# Patient Record
Sex: Female | Born: 1945 | Race: White | Hispanic: No | Marital: Married | Smoking: Never smoker
Health system: Southern US, Community
[De-identification: ages and names within clinical notes are randomized; demographics above are authoritative.]

## PROBLEM LIST (undated history)

## (undated) DIAGNOSIS — IMO0002 Reserved for concepts with insufficient information to code with codable children: Secondary | ICD-10-CM

## (undated) DIAGNOSIS — G43909 Migraine, unspecified, not intractable, without status migrainosus: Secondary | ICD-10-CM

## (undated) DIAGNOSIS — I00 Rheumatic fever without heart involvement: Secondary | ICD-10-CM

## (undated) DIAGNOSIS — I10 Essential (primary) hypertension: Secondary | ICD-10-CM

## (undated) DIAGNOSIS — IMO0001 Reserved for inherently not codable concepts without codable children: Secondary | ICD-10-CM

## (undated) DIAGNOSIS — M1990 Unspecified osteoarthritis, unspecified site: Secondary | ICD-10-CM

## (undated) DIAGNOSIS — Z5189 Encounter for other specified aftercare: Secondary | ICD-10-CM

## (undated) DIAGNOSIS — M8580 Other specified disorders of bone density and structure, unspecified site: Secondary | ICD-10-CM

## (undated) DIAGNOSIS — N200 Calculus of kidney: Secondary | ICD-10-CM

## (undated) DIAGNOSIS — K219 Gastro-esophageal reflux disease without esophagitis: Secondary | ICD-10-CM

## (undated) HISTORY — PX: OTHER SURGICAL HISTORY: SHX169

## (undated) HISTORY — DX: Gastro-esophageal reflux disease without esophagitis: K21.9

## (undated) HISTORY — DX: Other specified disorders of bone density and structure, unspecified site: M85.80

## (undated) HISTORY — PX: PARTIAL HYSTERECTOMY: SHX80

## (undated) HISTORY — DX: Reserved for concepts with insufficient information to code with codable children: IMO0002

## (undated) HISTORY — DX: Essential (primary) hypertension: I10

## (undated) HISTORY — DX: Migraine, unspecified, not intractable, without status migrainosus: G43.909

## (undated) HISTORY — DX: Unspecified osteoarthritis, unspecified site: M19.90

---

## 1998-06-22 ENCOUNTER — Other Ambulatory Visit: Admission: RE | Admit: 1998-06-22 | Discharge: 1998-06-22 | Payer: Self-pay | Admitting: Gynecology

## 1999-07-14 ENCOUNTER — Other Ambulatory Visit: Admission: RE | Admit: 1999-07-14 | Discharge: 1999-07-14 | Payer: Self-pay | Admitting: Gynecology

## 2000-08-15 ENCOUNTER — Other Ambulatory Visit: Admission: RE | Admit: 2000-08-15 | Discharge: 2000-08-15 | Payer: Self-pay | Admitting: Gynecology

## 2001-08-27 ENCOUNTER — Other Ambulatory Visit: Admission: RE | Admit: 2001-08-27 | Discharge: 2001-08-27 | Payer: Self-pay | Admitting: Gynecology

## 2002-04-16 DIAGNOSIS — N200 Calculus of kidney: Secondary | ICD-10-CM

## 2002-04-16 HISTORY — DX: Calculus of kidney: N20.0

## 2002-04-30 ENCOUNTER — Encounter: Payer: Self-pay | Admitting: General Surgery

## 2002-04-30 ENCOUNTER — Encounter: Admission: RE | Admit: 2002-04-30 | Discharge: 2002-04-30 | Payer: Self-pay | Admitting: Women's Health

## 2002-09-16 ENCOUNTER — Encounter: Payer: Self-pay | Admitting: Surgery

## 2002-09-16 ENCOUNTER — Encounter: Admission: RE | Admit: 2002-09-16 | Discharge: 2002-09-16 | Payer: Self-pay | Admitting: Surgery

## 2002-09-25 ENCOUNTER — Encounter: Payer: Self-pay | Admitting: Surgery

## 2002-09-25 ENCOUNTER — Encounter: Admission: RE | Admit: 2002-09-25 | Discharge: 2002-09-25 | Payer: Self-pay | Admitting: Surgery

## 2002-10-14 ENCOUNTER — Other Ambulatory Visit: Admission: RE | Admit: 2002-10-14 | Discharge: 2002-10-14 | Payer: Self-pay | Admitting: Gynecology

## 2002-11-07 ENCOUNTER — Observation Stay (HOSPITAL_COMMUNITY): Admission: EM | Admit: 2002-11-07 | Discharge: 2002-11-08 | Payer: Self-pay

## 2002-11-07 ENCOUNTER — Encounter: Payer: Self-pay | Admitting: General Surgery

## 2002-11-12 ENCOUNTER — Ambulatory Visit (HOSPITAL_BASED_OUTPATIENT_CLINIC_OR_DEPARTMENT_OTHER): Admission: RE | Admit: 2002-11-12 | Discharge: 2002-11-12 | Payer: Self-pay | Admitting: Urology

## 2002-11-12 ENCOUNTER — Encounter: Payer: Self-pay | Admitting: Urology

## 2003-01-08 ENCOUNTER — Encounter: Admission: RE | Admit: 2003-01-08 | Discharge: 2003-01-08 | Payer: Self-pay | Admitting: Diagnostic Radiology

## 2003-10-14 ENCOUNTER — Other Ambulatory Visit: Admission: RE | Admit: 2003-10-14 | Discharge: 2003-10-14 | Payer: Self-pay | Admitting: Gynecology

## 2004-02-24 ENCOUNTER — Encounter: Admission: RE | Admit: 2004-02-24 | Discharge: 2004-02-24 | Payer: Self-pay | Admitting: Family Medicine

## 2004-08-24 ENCOUNTER — Encounter: Admission: RE | Admit: 2004-08-24 | Discharge: 2004-08-24 | Payer: Self-pay | Admitting: Surgery

## 2004-10-19 ENCOUNTER — Other Ambulatory Visit: Admission: RE | Admit: 2004-10-19 | Discharge: 2004-10-19 | Payer: Self-pay | Admitting: Gynecology

## 2005-11-14 ENCOUNTER — Other Ambulatory Visit: Admission: RE | Admit: 2005-11-14 | Discharge: 2005-11-14 | Payer: Self-pay | Admitting: Gynecology

## 2006-02-22 ENCOUNTER — Encounter: Payer: Self-pay | Admitting: Family Medicine

## 2006-12-05 ENCOUNTER — Other Ambulatory Visit: Admission: RE | Admit: 2006-12-05 | Discharge: 2006-12-05 | Payer: Self-pay | Admitting: Gynecology

## 2007-04-17 LAB — HM MAMMOGRAPHY

## 2007-09-19 ENCOUNTER — Encounter: Admission: RE | Admit: 2007-09-19 | Discharge: 2007-09-19 | Payer: Self-pay | Admitting: Specialist

## 2007-09-25 ENCOUNTER — Encounter: Admission: RE | Admit: 2007-09-25 | Discharge: 2007-09-25 | Payer: Self-pay | Admitting: Specialist

## 2008-01-14 ENCOUNTER — Inpatient Hospital Stay (HOSPITAL_COMMUNITY): Admission: RE | Admit: 2008-01-14 | Discharge: 2008-01-18 | Payer: Self-pay | Admitting: Orthopedic Surgery

## 2008-09-01 ENCOUNTER — Encounter: Payer: Self-pay | Admitting: Family Medicine

## 2008-09-23 DIAGNOSIS — Z87442 Personal history of urinary calculi: Secondary | ICD-10-CM

## 2008-09-23 DIAGNOSIS — G43909 Migraine, unspecified, not intractable, without status migrainosus: Secondary | ICD-10-CM | POA: Insufficient documentation

## 2008-09-23 DIAGNOSIS — M1990 Unspecified osteoarthritis, unspecified site: Secondary | ICD-10-CM | POA: Insufficient documentation

## 2008-09-23 DIAGNOSIS — IMO0002 Reserved for concepts with insufficient information to code with codable children: Secondary | ICD-10-CM

## 2008-09-23 DIAGNOSIS — I019 Acute rheumatic heart disease, unspecified: Secondary | ICD-10-CM

## 2008-09-23 DIAGNOSIS — K219 Gastro-esophageal reflux disease without esophagitis: Secondary | ICD-10-CM

## 2008-09-24 ENCOUNTER — Ambulatory Visit: Payer: Self-pay | Admitting: Cardiology

## 2008-09-24 DIAGNOSIS — I099 Rheumatic heart disease, unspecified: Secondary | ICD-10-CM | POA: Insufficient documentation

## 2008-09-24 DIAGNOSIS — I10 Essential (primary) hypertension: Secondary | ICD-10-CM

## 2008-09-24 DIAGNOSIS — R109 Unspecified abdominal pain: Secondary | ICD-10-CM | POA: Insufficient documentation

## 2008-09-24 DIAGNOSIS — R011 Cardiac murmur, unspecified: Secondary | ICD-10-CM

## 2008-10-08 ENCOUNTER — Ambulatory Visit: Payer: Self-pay

## 2008-10-08 ENCOUNTER — Encounter: Payer: Self-pay | Admitting: Cardiology

## 2009-01-11 ENCOUNTER — Encounter: Admission: RE | Admit: 2009-01-11 | Discharge: 2009-01-11 | Payer: Self-pay | Admitting: Orthopedic Surgery

## 2009-02-14 ENCOUNTER — Encounter: Payer: Self-pay | Admitting: *Deleted

## 2009-02-14 LAB — CONVERTED CEMR LAB: Pap Smear: NORMAL

## 2009-03-02 ENCOUNTER — Encounter: Payer: Self-pay | Admitting: Family Medicine

## 2009-03-03 ENCOUNTER — Encounter: Payer: Self-pay | Admitting: Cardiology

## 2009-06-06 ENCOUNTER — Encounter: Payer: Self-pay | Admitting: *Deleted

## 2009-06-09 ENCOUNTER — Ambulatory Visit: Payer: Self-pay | Admitting: Family Medicine

## 2009-06-09 DIAGNOSIS — M949 Disorder of cartilage, unspecified: Secondary | ICD-10-CM

## 2009-06-09 DIAGNOSIS — M899 Disorder of bone, unspecified: Secondary | ICD-10-CM | POA: Insufficient documentation

## 2010-05-01 ENCOUNTER — Encounter: Payer: Self-pay | Admitting: Family Medicine

## 2010-05-16 NOTE — Assessment & Plan Note (Signed)
Summary: BRAND NEW PT TO LBF/TO EST/CJR/RSC PT/CJR   Vital Signs:  Patient profile:   65 year old female Menstrual status:  postmenopausal Height:      62.50 inches Weight:      157 pounds Temp:     98.0 degrees F oral Pulse rate:   88 / minute Pulse rhythm:   regular Resp:     12 per minute BP sitting:   138 / 78  (left arm) Cuff size:   regular  Vitals Entered By: Sid Falcon LPN (June 09, 2009 3:21 PM) CC: New to establish, Hypertension Management   History of Present Illness: New patient to establish care.  Past medical history significant for hypertension which was recently diagnosed, GERD, migraine headaches, history of kidney stones, history of rheumatic fever in childhood with recent essentially normal echocardiogram. History right total hip replacement 2009 secondary to complications from fracture.  No recent kidnesy stones.  Kidney stone extraction 2004.  GERD symptoms controlled with Zegerid.  No migraine headaches in a few years.  Patient recently started on Micardis for hypertension. Feels much better overall. No side effects from medication.  Prevention:  GYN ([place]).  DEXA 02/2009 Osteopenia.  Pt on Ca and Vit D.  Mammogram 5/10 normal.  Lipids 11/10 HDL 75, LDL 107.  Virtual Colonoscopy 2009.  Hypertension History:      She denies headache, chest pain, palpitations, dyspnea with exertion, orthopnea, peripheral edema, neurologic problems, syncope, and side effects from treatment.        Positive major cardiovascular risk factors include female age 61 years old or older and hypertension.  Negative major cardiovascular risk factors include non-tobacco-user status.     Preventive Screening-Counseling & Management  Alcohol-Tobacco     Smoking Status: never  Allergies: 1)  ! Sulfa  Past History:  Family History: Last updated: 09/23/2008  COPD.  Aneurysm.   Social History: Last updated: 06/09/2009 Married  Tobacco Use - No.  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 01/14/2008   1. Hysterectomy ( Partial sec to fibroids)  2. Kidney stones removed. .. 11/12/2002  3. Bartholin cyst surgery PMH-FH-SH reviewed for relevance  Social History: Married  Tobacco Use - No.  Works with [employer]  Review of Systems  The patient denies anorexia, fever, weight loss, weight gain, chest pain, syncope, dyspnea on exertion, peripheral edema, prolonged cough, headaches, hemoptysis, abdominal pain, melena, hematochezia, severe indigestion/heartburn, hematuria, suspicious skin lesions, depression, enlarged lymph nodes, and breast masses.    Physical Exam  General:  Well-developed,well-nourished,in no acute distress; alert,appropriate and cooperative throughout examination Head:  Normocephalic and atraumatic without obvious abnormalities. No apparent alopecia or balding. Eyes:  pupils equal, pupils round, and pupils reactive to light.   Ears:  External ear exam shows no significant lesions or deformities.  Otoscopic examination reveals clear canals, tympanic membranes are intact bilaterally without bulging, retraction, inflammation or discharge. Hearing is grossly normal bilaterally. Mouth:  Oral mucosa and oropharynx without lesions or exudates.  Teeth in good repair. Neck:  No deformities, masses, or tenderness noted. Lungs:  Normal respiratory effort, chest expands symmetrically. Lungs are clear to auscultation, no crackles or wheezes. Heart:  normal rate and regular rhythm.   Msk:  No deformity or scoliosis noted of  thoracic or lumbar spine.   Extremities:  No clubbing, cyanosis,  edema, or deformity noted with normal full range of motion of all joints.   Neurologic:  alert & oriented X3, cranial nerves II-XII intact, strength normal in all extremities, and gait normal.   Cervical Nodes:  No lymphadenopathy noted Psych:  Oriented X3, normally interactive, good eye contact, not anxious appearing, and not depressed appearing.     Impression & Recommendations:  Problem # 1:  HYPERTENSION, BENIGN (ICD-401.1) controlled. Her updated medication list for this problem includes:    Micardis Hct 80-25 Mg Tabs (Telmisartan-hctz) ..... Once daily  Problem # 2:  GERD (ICD-530.81)  Her updated medication list for this problem includes:    Zegerid 40-1100 Mg Caps (Omeprazole-sodium bicarbonate) .Marland Kitchen... As needed  Problem # 3:  MIGRAINE HEADACHE (ICD-346.90)  Her updated medication list for this problem includes:    Maxalt 10 Mg Tabs (Rizatriptan benzoate) .Marland Kitchen... As needed    Aspirin 81 Mg Tbec (Aspirin) .Marland Kitchen... Take one tablet by mouth daily  Problem # 4:  RENAL CALCULUS, HX OF (ICD-V13.01)  Problem # 5:  OSTEOPENIA (ICD-733.90)  Her updated medication list for this problem includes:    Calcium-magnesium 500-250 Mg Tabs (Calcium-magnesium) .Marland Kitchen... 1 tab once daily    Vitamin D3 2000 Unit Caps (Cholecalciferol) .Marland Kitchen... 1 cap once daily  Complete Medication List: 1)  Climara 0.05 Mg/24hr Ptwk (Estradiol) .... Change weekly 2)  Maxalt 10 Mg Tabs (Rizatriptan benzoate) .... As needed 3)  Zegerid 40-1100 Mg Caps (Omeprazole-sodium bicarbonate) .... As needed 4)  Multivitamins Tabs (Multiple vitamin) .Marland Kitchen.. 1 tab once daily 5)  Ferrous Sulfate 325 (65 Fe) Mg Tabs (Ferrous sulfate) .Marland Kitchen.. 1 tab weekly 6)  Aspirin 81 Mg Tbec (Aspirin) .... Take one tablet by mouth daily 7)  Vitamin-b Complex Tabs (B complex vitamins) .Marland Kitchen.. 1 -2 tab once daily 8)  Silymarin Caps (Milk thistle-turmeric) .Marland Kitchen.. 1 cap once daily 9)  Ester-c 500-50 Mg Tabs (Bioflavonoid products) .Marland Kitchen.. 1 tab once daily 10)   Calcium-magnesium 500-250 Mg Tabs (Calcium-magnesium) .Marland Kitchen.. 1 tab once daily 11)  Vitamin D3 2000 Unit Caps (Cholecalciferol) .Marland Kitchen.. 1 cap once daily 12)  St Johns Wort 300 Mg Caps ([address] johns wort) .Marland Kitchen.. 1 cap three times a day 13)  Glucosamine-chondroitin Caps (Glucosamine-chondroit-vit c-mn) .Marland Kitchen.. 1 cap once daily 14)  Micardis Hct 80-25 Mg Tabs (Telmisartan-hctz) .... Once daily  Hypertension Assessment/Plan:      The patient's hypertensive risk group is category B: At least one risk factor (excluding diabetes) with no target organ damage.  Today's blood pressure is 138/78.    Patient Instructions: 1)  Please schedule a follow-up appointment in 6 months .  2)  It is important that you exercise reguarly at least 20 minutes 5 times a week. If you develop chest pain, have severe difficulty breathing, or feel very tired, stop exercising immediately and seek medical attention.  3)  Check your  Blood Pressure regularly . If it is above:140/90   you should make an appointment.  Preventive Care Screening  Last Tetanus Booster:    Date:  04/17/2003    Results:  Historical     Preventive Care Screening  Last Tetanus Booster:    Date:  04/17/2003    Results:  Historical

## 2010-05-24 NOTE — Letter (Signed)
Summary: Annual Exam-Dr. Beather Arbour  Annual Exam-Dr. Beather Arbour   Imported By: Maryln Gottron 05/15/2010 09:44:52  _____________________________________________________________________  External Attachment:    Type:   Image     Comment:   External Document

## 2010-08-11 ENCOUNTER — Encounter: Payer: Self-pay | Admitting: Family Medicine

## 2010-08-11 ENCOUNTER — Ambulatory Visit (INDEPENDENT_AMBULATORY_CARE_PROVIDER_SITE_OTHER): Payer: BC Managed Care – PPO | Admitting: Family Medicine

## 2010-08-11 VITALS — BP 122/72 | Temp 98.4°F | Wt 152.0 lb

## 2010-08-11 DIAGNOSIS — I10 Essential (primary) hypertension: Secondary | ICD-10-CM

## 2010-08-11 DIAGNOSIS — Z0000 Encounter for general adult medical examination without abnormal findings: Secondary | ICD-10-CM

## 2010-08-11 DIAGNOSIS — Z23 Encounter for immunization: Secondary | ICD-10-CM

## 2010-08-11 DIAGNOSIS — Z2911 Encounter for prophylactic immunotherapy for respiratory syncytial virus (RSV): Secondary | ICD-10-CM

## 2010-08-11 NOTE — Progress Notes (Signed)
  Subjective:    Patient ID: [name], female    DOB: 1945/04/25, 65 y.o.   MRN: [id number]  HPI Patient has history of hypertension, GERD, osteoarthritis and is seen today requesting shingles vaccine. No history of shingles clinically. Did have chickenpox as a child. She has seen several family members suffer with effects from shingles. She does not have any allergy to gelatin or neomycin. No immunosuppression. No recent prednisone use.  She has hypertension well controlled with Micardis HCTZ. Denies any recent chest pains, dyspnea, headache, fever, chills   Review of Systems As per history of present illness    Objective:   Physical Exam  Constitutional: She is oriented to person, place, and time. She appears well-developed and well-nourished.  HENT:  Right Ear: External ear normal.  Left Ear: External ear normal.  Mouth/Throat: Oropharynx is clear and moist. No oropharyngeal exudate.  Neck: No thyromegaly present.  Cardiovascular: Normal rate, regular rhythm and normal heart sounds.   No murmur heard. Pulmonary/Chest: Effort normal and breath sounds normal. No respiratory distress. She has no wheezes. She has no rales.  Musculoskeletal: She exhibits no edema.  Lymphadenopathy:    She has no cervical adenopathy.  Neurological: She is alert and oriented to person, place, and time.          Assessment & Plan:  #1  Immunization prevention. Discussed risks and benefits of shingles vaccine and she consents. She is willing to pay even if insurance doesn't cover. She has no contraindications. #2  Hypertension-controlled.

## 2010-08-29 NOTE — H&P (Signed)
NAMEGLENNIE, BOSE                  ACCOUNT NO.:  [id number]   MEDICAL RECORD NO.:  [id number]          PATIENT TYPE:  INP   LOCATION:                               FACILITY:  [facility]   PHYSICIAN:  Madlyn Frankel. Charlann Boxer, M.D.  DATE OF BIRTH:  13-Sep-1945   DATE OF ADMISSION:  01/14/2008  DATE OF DISCHARGE:                              HISTORY & PHYSICAL   PROCEDURE:  Right total hip replacement.   CHIEF COMPLAINT:  Right hip pain.   HISTORY OF PRESENT ILLNESS:  A  65 year old female with history of right  hip pain secondary to osteoarthritis, refractory to all conservative  treatments.  She has been pre-surgically assessed by her primary care  physician, Dr. Arlyce Dice at [facility].   PAST MEDICAL HISTORY:  Significant for:  1. Osteoarthritis.  2. Migraines.  3. Rheumatic fever disease with heart murmur.  4. Reflux disease.  5. History of kidney stones.  6. Degenerative disc disease.  7. Postmenopausal.   PAST SURGICAL HISTORY:  1. Hysterectomy.  2. Kidney stones removed.  3. Bartholin cyst surgery.   FAMILY HISTORY:  COPD.  Aneurysm.   SOCIAL HISTORY:  Married, nonsmoker.  She works at [employer].  Primary caregiver will be family in the home.   DRUG ALLERGIES:  No known drug allergies.   MEDICATIONS:  1. Climara patch, change weekly.  2. Ultram 50 mg p.r.n. pain.  3. Multivitamins daily.  4. Calcium plus magnesium plus zinc daily.  5. Iron daily.  6. Vitamin D 2,000 units.  7. Fish oil daily.   REVIEW OF SYSTEMS:  See HPI.   PHYSICAL EXAMINATION:  VITAL SIGNS: Pulse 72, respirations 16, blood  pressure 162/68.  Height 5 feet 3 inches, weight 139 pounds.  GENERAL:  Awake alert and oriented.  Well-developed, well-nourished.  NECK:  Supple, no carotid bruits.  CHEST:  Lungs are clear to auscultation bilaterally.  BREASTS:  Deferred.  HEART:  Regular rate and rhythm, S1, S2, distinct.  ABDOMEN:  Soft, nontender, nondistended.  Bowel sounds  present.  GENITOURINARY:  Deferred.  EXTREMITIES:  Right hip has increased pain with range of motion.  SKIN:  No cellulitis.  NEUROLOGICAL:  Intact distal sensibilities.   LABORATORY DATA:  Labs, EKG and chest x-ray are all pending preadmission  testing.   IMPRESSION:  Right hip osteoarthritis.   PLAN OF ACTION:  Right total hip replacement a [facility] on  January 14, 2008 by Dr. Durene Romans.  The risks and complications  were discussed.   Postoperative medications including Robaxin, Lovenox, iron, aspirin,  Colace and MiraLax were provided at the time of history and physical.   PLANNED DISPOSITION:  Home health care for PT.     ______________________________  Yetta Glassman. [address], [place]      Madlyn Frankel. Charlann Boxer, M.D.  Electronically Signed    BLM/MEDQ  D:  01/09/2008  T:  01/09/2008  Job:  [id number]

## 2010-08-29 NOTE — Op Note (Signed)
NAMEGIANNI, FUCHS                  ACCOUNT NO.:  [id number]   MEDICAL RECORD NO.:  [id number]          PATIENT TYPE:  INP   LOCATION:  1604                         FACILITY:  [facility]   PHYSICIAN:  Madlyn Frankel. Charlann Boxer, M.D.  DATE OF BIRTH:  [birth year]   DATE OF PROCEDURE:  01/14/2008  DATE OF DISCHARGE:                               OPERATIVE REPORT   PREOPERATIVE DIAGNOSIS:  Right hip degenerative joint disease.   POSTOPERATIVE DIAGNOSIS:  Right hip degenerative joint disease.   PROCEDURE:  Right total hip replacement.   COMPONENTS USED:  DePuy hip system, size 50 Pinnacle cup, single  cancellous screw, a 36 metal neutral liner. The femoral stem was a size  5 high offset Tri-Lock stem with 36 1.5 aSphere ball.   SURGEON:  Dr. Charlann Boxer.   ASSISTANT:  Dwyane Luo, PA.   ANESTHESIA:  Spinal.   ESTIMATED BLOOD LOSS:  700.   DRAINS:  Times one.   COMPLICATIONS:  None.   INDICATIONS FOR PROCEDURE:  Ms. Lemaire is a 65 year old female with 8-  month persistent and progressive right hip pain.  Radiographs initially  revealed some joint space, but advanced MRI imaging indicated  significant edematous change within the entire proximal head, consistent  with either avascular changes or stress-related phenomenon.  After a  very diligent course of nonoperative intervention in terms of limited  weightbearing status for a prolonged period time, she had persistent  recurring problems.  Given the persistence and the duration of the  symptoms she was having, she was very interested in discussing hip  replacement surgery.  We reviewed the standard discussion of risks and  benefits, including infection, DVT, component failure, need for  revision, and the postoperative course expectations, all for the  exchange of benefit.  She was interesting in pursuing this.   PROCEDURE IN DETAIL:  The patient was brought to the operative theater.  Once adequate anesthesia and preoperative antibiotics with Ancef  administered, the patient was positioned in the left lateral decubitus  position with the right side up.  The right lower extremity was pre-  scrubbed and prepped and draped in sterile fashion.   Following a time-out, the incision was made laterally on the hip,  followed by sharp dissection to the iliotibial band and gluteal fascia.  These were then incised posteriorly for approach to the hip.  The short  external rotators were taken down and separated from the posterior  capsule.  An L-capsulotomy was made, preserving the posterior capsule to  protect the sciatic nerve from retractors, but also to repair at the end  of the case.  The hip was dislocated and a neck osteotomy made based off  anatomic landmarks and utilizing a trial neck to identify the correct  cut on the femoral neck.   Significant osteoarthritic changes were noted along the anterior dome  and superior dome of her femoral head, consistent with arthritic  changes.  They were resulting in her pain that she was having.   Attention was first directed to the femur.  The femoral canal was  prepared with use  of a starting drill, hand reamer times one, and then  irrigation.  I broached up to a size 4 initially and used a calcar  planer.   Following this, I packed off the femur.  During the case, she had some  oozing which I fell was coming from the bone, as there was no  significant vascular bleeding or venous bleeding even in the short  external rotators.   The acetabular retractors were placed and the femur was retracted and  was placed anterior.  Acetabular exposure was continued with labrum  debridement.  I began reaming with a 44 reamer and carried up to a 49  reamer, impacted a 50-mm cup, which appeared to be forward flexed at  about 20 degrees and abducted about 40 degrees.   I put a single cancellous screw in and had an excellent bite along the  anterior rim.  I felt that her iliac column was fairly narrow and it  was  hard to get a second screw in without it going back out posterior.  I  chose not to put another screw in.  A neutral trial liner was placed, 36  mm.  Trial reduction was now carried out with a 4 broach high-offset  neck.  When I did this in extension, there was still a few millimeters  of shuck.  The hip stability was very good without any evidence of  subluxation, but there was shuck.  For this reason, I removed this 4  broach and impacted a 5 broach.  It sat a couple of millimeters proud of  the neck cut.  With this most importantly her leg lengths appeared to be  very equal as I compared the operative leg to the down leg in the  preoperative position.  Also, with extension there was only minimal  shuck present.  Hip stability was very good without any evidence  impingement with external rotation and extension, even with abduction  and no evidence impingement or subluxation with forward flexion or  internal rotation up to 80 degrees.   Given all these parameters, the trial components were removed.  The  final central hole limiter placed in the femur and the final 36 metal  neutral liner impacted without difficulty.  The final 5 high Tri-Lock  stem was then impacted to the level where the broach sat.  Based on  this, a 36 aSphere plus 1.5-mm ball was then impacted onto a dry  trunnion.  The hip was reduced.   We reapproximated the capsular tissue with a #1 Ethibond.  The remainder  of the wound was closed with #1 Vicryl running on the gluteal fascia and  #1 Vicryl on the iliotibial band.   The remainder of the wound was closed with 2-0 Vicryl and a running 4-0  Monocryl.  The hip was cleaned, dried, and dressed sterilely with Steri-  Strips and a Mepilex dressing.  She was brought to the recovery room in  stable condition.      Madlyn Frankel Charlann Boxer, M.D.  Electronically Signed     MDO/MEDQ  D:  01/14/2008  T:  01/15/2008  Job:  [id number]

## 2010-09-01 NOTE — Discharge Summary (Signed)
[name], [name]                  ACCOUNT NO.:  [id number]   MEDICAL RECORD NO.:  [id number]          PATIENT TYPE:  INP   LOCATION:  1604                         [facility]ACILITY:  [facility]   PHYSICIAN:  [name]. [name], M.D.  DATE OF BIRTH:  September 21, 1945   DATE OF ADMISSION:  01/14/2008  DATE OF DISCHARGE:  01/18/2008                               DISCHARGE SUMMARY   ADMISSION DIAGNOSES:  1. Osteoarthritis.  2. Migraines.  3. Rheumatic fever disease with heart murmur.  4. Reflux disease.  5. History of kidney stones.  6. Degenerative disk disease.  7. Postmenopausal.   DISCHARGE DIAGNOSES:  1. Osteoarthritis.  2. Migraines.  3. Rheumatic fever disease with heart murmur.  4. Reflux disease.  5. History of kidney stones.  6. Degenerative disk disease.  7. Postmenopausal.  8. Postoperative acute blood loss anemia, resolved after transfusion.   HISTORY OF PRESENT ILLNESS:  A 65 year old female with a history of  right hip pain secondary to osteoarthritis, refractory to all  conservative treatments.   CONSULTS:  None.   PROCEDURE:  Right total hip replacement by surgeon, Lajoyce Corners.  Assistant was [employer], PA-C.   LABORATORY DATA:  Labs on admission were CBC with no abnormalities.  At  the time of discharge, white blood cells 11.9, hemoglobin 10.6,  hematocrit 30.8, and platelets 152.  White cell differential all within  normal limits.  Her coagulation was normal.  Routine chemistry upon  admission:  No significant abnormalities.  Final readings:  Sodium 140,  potassium 3.9, glucose 124, creatinine 0.68.  Her kidney function was  good with a GFR greater than 60 throughout, and her calcium was 8.9.  Her UA shows some small leukocyte esterase with a few bacteria.  She was  transfused 2 units of blood while in the hospital due to the acute blood  loss anemia.   RADIOLOGY:  No chest x-ray found in chart.   CARDIOLOGY:  No EKG found in chart.   HOSPITAL COURSE:  Patient admitted and  underwent right total hip  replacement.  Admitted to the orthopedic floor.  On the first day, she  had a little bit of nausea.  Was transfused 2 units of blood due to the  acute blood loss anemia.  Seen the next day.  Was doing okay.  Still was  having a little bit of nausea.  The left hip wound was checked.  The  wound was dry, __________.  She was making good progress with physical  therapy and we replaced a little bit of potassium.   Over the next couple of days, her nausea resumed.  She made good  progress with her physical therapy.  No other complaints or events.   Seen on day #4.  She was stable.  The wound was dry, and she was ready  for discharge.   DISCHARGE DISPOSITION:  Discharged home in stable and improved condition  with home health care PT.   DISCHARGE DIET:  Regular.   DISCHARGE WOUND CARE:  Keep dry.   DISCHARGE MEDICATIONS:  1. Lovenox 40 mg subcu q.24h.  2. Robaxin 500 mg p.o. q.6h.  3. Enteric-coated aspirin 325 mg p.o. daily.  4. Iron 325 mg p.o. t.i.d.  5. Colace 100 mg p.o. b.i.d.  6. MiraLax 70 gm p.o. daily.  7. Ultram 50 mg 1-2 p.o. q.6h. p.r.n. pain.  8. Multivitamin daily.  9. B complex daily.  10.Calcium with magnesium b.i.d.  11.Iron b.i.d.  12.Vitamin D 1000 units daily.   DISCHARGE FOLLOWUP:  Follow up with Dr. Charlann Rogers at phone number [phone] in  two weeks for wound check.     ______________________________  [name]. [name], [place]      [name]. [name], M.D.  Electronically Signed    BLM/MEDQ  D:  02/12/2008  T:  02/12/2008  Job:  [id number]   cc:   Dr. Kasandra Knudsen [facility]

## 2010-09-01 NOTE — Op Note (Signed)
NAME:  [name], [name]                     ACCOUNT NO.:  [id number]   MEDICAL RECORD NO.:  [id number]                   PATIENT TYPE:  AMB   LOCATION:  [facility]                                 FACILITY:  [facility]   PHYSICIAN:  Bertram Millard. Dahlstedt, M.D.          DATE OF BIRTH:  [birth year]   DATE OF PROCEDURE:  11/12/2002  DATE OF DISCHARGE:                                 OPERATIVE REPORT   PREOPERATIVE DIAGNOSIS:  Left distal ureteral stone.   POSTOPERATIVE DIAGNOSIS:  Left distal ureteral stone.   PRINCIPAL PROCEDURE:  1. Cystoscopy.  2. Left ureteroscopy.  3. Holmium laser lithotripsy of left ureteral stone.  4. Double-J stent placement.  5. Interpretative fluoroscopy.   SURGEON:  Bertram Millard. Dahlstedt, M.D.   ANESTHESIA:  General with LMA.   COMPLICATIONS:  None.   BRIEF HISTORY:  This nice, 65 year old lady presented to my office yesterday  on follow-up of recent hospitalization for left flank pain.  She was  diagnosed with a 4-5 x 6 mm left ureteral stone with moderate  hydronephrosis.  She incidentally was found in the past to have a right  renal cyst which was small but complex in nature.  The patient has not  passed the stone, has been dependent on pain medicines, and desires to have  the stone managed with intervention.  We talked about treatment options  including lithotripsy and ureteroscopy.  This is a very distal stone and I  think most amenable to ureteroscopy.  She is aware of risks and  complications of the procedure and desires to proceed.   DESCRIPTION OF PROCEDURE:  The patient was administered preoperative IV  antibiotics as prophylaxis per a history of rheumatic fever as a child.  She  was brought to the operating room where general anesthetic was administered.  She was placed in the dorsal lithotomy position.  Genitalia and perineum  were prepped and draped.  A cystoscope was passed into her bladder.  The  left ureteral orifice was somewhat edematous.  A  Glidewire was placed,  passed the distal stone, and up into the left renal pelvis using  fluoroscopic guidance.  The scope was then removed and replaced with a 6  French short ureteroscope.  The stone was encountered just up the urethra,  and the stone was fragmented into multiple small fragments with the holmium  laser at a level of 0.6 joules.  Several small fragments were removed.  The  rest of he fragments were powder-like, and it was felt that these would be  able to be passed easily.  Following extraction of the stone fragments, the  ureteroscope revealed no further large fragments.  The scope was then  removed and replaced with a cystoscope.  Through the cystoscope, a 24 cm x 6  French double-J stent was placed using cystoscopic and fluoroscopic  guidance.  Good positioning was seen.  The guidewire was removed.  The  bladder was drained and  the procedure terminated after the scope was  removed.   The patient tolerated the procedure well.  She was administered Toradol and  a B&O suppository prior to the reversal of anesthetic.  She was taken to the  PACU in stable condition.   She will follow up in my office in on the 10th of August.  She was sent home  on Cipro XR 1 daily for about 6 days.  She was also sent home on Oxybutynin  5 mg 1 p.o. p.r.n. bladder spasms.  She has oxycodone at home.                                               Bertram Millard. Dahlstedt, M.D.    SMD/MEDQ  D:  11/12/2002  T:  11/12/2002  Job:  [id number]   cc:   Teena Irani. Arlyce Dice, M.D.  [address]  [place]  [place] [address]  Fax: [phone]

## 2011-01-15 LAB — TYPE AND SCREEN: ABO/RH(D): O POS

## 2011-01-15 LAB — DIFFERENTIAL
Basophils Relative: 1
Eosinophils Absolute: 0.1
Eosinophils Relative: 2
Monocytes Absolute: 0.4
Monocytes Relative: 7
Neutrophils Relative %: 71

## 2011-01-15 LAB — BASIC METABOLIC PANEL
Calcium: 10.4
Chloride: 103
Glucose, Bld: 109 — ABNORMAL HIGH

## 2011-01-15 LAB — CBC
HCT: 39
Hemoglobin: 13.2
MCV: 90.3
Platelets: 277
RBC: 4.32
WBC: 5.2

## 2011-01-15 LAB — ABO/RH: ABO/RH(D): O POS

## 2011-01-15 LAB — PROTIME-INR
INR: 1
Prothrombin Time: 13.4

## 2011-01-15 LAB — URINALYSIS, ROUTINE W REFLEX MICROSCOPIC
Bilirubin Urine: NEGATIVE
Hgb urine dipstick: NEGATIVE
Specific Gravity, Urine: 1.018
Urobilinogen, UA: 1
pH: 7.5

## 2011-01-15 LAB — URINE MICROSCOPIC-ADD ON

## 2011-01-16 LAB — BASIC METABOLIC PANEL
BUN: 14
BUN: 9
Calcium: 8.9
Chloride: 106
Creatinine, Ser: 0.71
GFR calc non Af Amer: >60
GFR calc non Af Amer: >60
Glucose, Bld: 121 — ABNORMAL HIGH
Glucose, Bld: 124 — ABNORMAL HIGH
Potassium: 4.2

## 2011-01-16 LAB — CBC
HCT: 24.4 — ABNORMAL LOW
MCV: 90
Platelets: 152
Platelets: 193
RDW: 12.4
RDW: 12.8
WBC: 11.9 — ABNORMAL HIGH

## 2011-01-16 LAB — PREPARE RBC (CROSSMATCH)

## 2011-04-26 ENCOUNTER — Encounter: Payer: Self-pay | Admitting: Family Medicine

## 2011-04-26 ENCOUNTER — Ambulatory Visit (INDEPENDENT_AMBULATORY_CARE_PROVIDER_SITE_OTHER): Payer: BC Managed Care – PPO | Admitting: Family Medicine

## 2011-04-26 VITALS — BP 140/90 | HR 80 | Temp 98.1°F | Resp 12 | Ht 62.75 in | Wt 156.0 lb

## 2011-04-26 DIAGNOSIS — I099 Rheumatic heart disease, unspecified: Secondary | ICD-10-CM

## 2011-04-26 DIAGNOSIS — I10 Essential (primary) hypertension: Secondary | ICD-10-CM

## 2011-04-26 DIAGNOSIS — Z01818 Encounter for other preprocedural examination: Secondary | ICD-10-CM

## 2011-04-26 NOTE — Progress Notes (Signed)
  Subjective:    Patient ID: [name], female    DOB: [birth year], 66 y.o.   MRN: [id number]  HPI  Surgical clearance. Planned total hip replacement. She's had other hip already done. She has question of rheumatic heart disease but recent echocardiogram 2010 and did not show any significant mitral stenosis. Normal ejection fraction. No history of CAD or peripheral vascular disease. No history of smoking. No chronic lung problems. She takes blood pressure medication and hypertension has been well controlled.  Past Medical History  Diagnosis Date  . Rheumatic fever with heart involvement   . GERD (gastroesophageal reflux disease)   . Renal calculus     hx of   . Degenerative disc disease   . Migraine headache   . Osteoarthritis   . Hypertension   . Osteopenia    Past Surgical History  Procedure Date  . Rt hip arthroplasty total     01/14/2008  . Partial hysterectomy     partial sec to fibroids  . Kidney stones removed     11/12/2002  . Bartholin cyst surgery     reports that she has never smoked. She does not have any smokeless tobacco history on file. She reports that she drinks alcohol. She reports that she does not use illicit drugs. family history includes Aneurysm in her other and COPD in her other. Allergies  Allergen Reactions  . Sulfonamide Derivatives     As a child      Review of Systems  Constitutional: Negative for appetite change, fatigue and unexpected weight change.  Respiratory: Negative for cough and shortness of breath.   Cardiovascular: Negative for chest pain, palpitations and leg swelling.  Gastrointestinal: Negative for abdominal pain and blood in stool.  Neurological: Negative for dizziness, syncope, weakness and headaches.  Hematological: Negative for adenopathy.       Objective:   Physical Exam  Constitutional: She is oriented to person, place, and time. She appears well-developed and well-nourished. No distress.  HENT:  Mouth/Throat:  Oropharynx is clear and moist.  Neck: Neck supple. No thyromegaly present.       No carotid bruits  Cardiovascular: Normal rate, regular rhythm and normal heart sounds.  Exam reveals no gallop.   No murmur heard. Pulmonary/Chest: Effort normal and breath sounds normal. No respiratory distress. She has no wheezes. She has no rales.  Abdominal: Soft. Bowel sounds are normal. She exhibits no distension and no mass. There is no tenderness. There is no rebound and no guarding.  Musculoskeletal: She exhibits no edema.  Lymphadenopathy:    She has no cervical adenopathy.  Neurological: She is alert and oriented to person, place, and time. No cranial nerve deficit.  Psychiatric: She has a normal mood and affect.          Assessment & Plan:  Healthy 66 year old female. EKG sinus rhythm no acute findings. No contraindications for surgery Forms completed.

## 2011-05-01 ENCOUNTER — Ambulatory Visit: Payer: BC Managed Care – PPO | Admitting: Family Medicine

## 2011-05-12 ENCOUNTER — Other Ambulatory Visit: Payer: Self-pay | Admitting: Internal Medicine

## 2011-05-12 ENCOUNTER — Ambulatory Visit (INDEPENDENT_AMBULATORY_CARE_PROVIDER_SITE_OTHER): Payer: BC Managed Care – PPO

## 2011-05-12 DIAGNOSIS — R509 Fever, unspecified: Secondary | ICD-10-CM

## 2011-05-12 DIAGNOSIS — N10 Acute tubulo-interstitial nephritis: Secondary | ICD-10-CM

## 2011-05-14 ENCOUNTER — Encounter (HOSPITAL_COMMUNITY): Payer: Self-pay

## 2011-05-15 LAB — URINE CULTURE: Colony Count: >=100000

## 2011-05-17 ENCOUNTER — Encounter (HOSPITAL_COMMUNITY): Payer: Self-pay | Admitting: *Deleted

## 2011-05-17 NOTE — Pre-Procedure Instructions (Signed)
PT INSTRUCTED HIBICLENS SHOWER NIGHT BEFORE SURGERY AND AM OF SURGERY--DO NOT USE ON FACE, HEAD, PRIVATE AREAS--MAY USE REGULAR SOAP AND SHAMPOO THOSE AREAS--DO NOT SHAVE SKIN 48 HOURS BEFORE USING HIBICLENS.

## 2011-05-18 IMAGING — CT CT EXTREM LOW W/O CM*R*
3 of 6 series · 13 of 46 positions shown, 19 images · non-contrast
Comparison: Pelvic radiographs 01/14/2008 and MR of the right hip
09/19/2007.

CLINICAL DATA: Anterior right hip and thigh pain for several
months.  History of right hip arthroplasty 12 months prior.

CT OF THE RIGHT HIP WITHOUT CONTRAST
TECHNIQUE: Multidetector CT imaging of the right hip was performed
according to the standard protocol without intravenous contrast.
Multiplanar CT image reconstructions were also generated.

[Series 3: standard · axial · 0.64mm/px · z∈[-271,-96]mm · 9 of 88 slices shown, 15 images]
[im 9/88  soft-tissue]
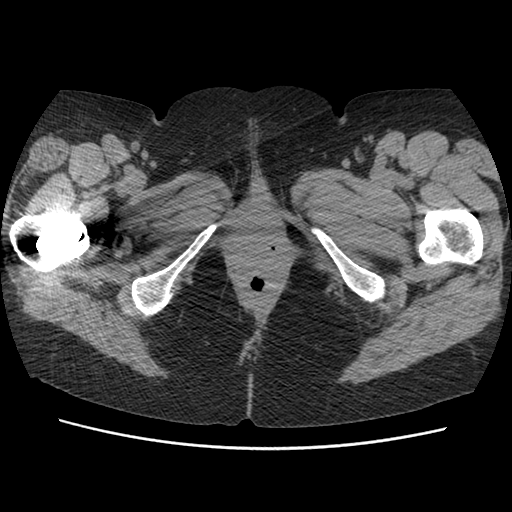
[im 9/88  bone]
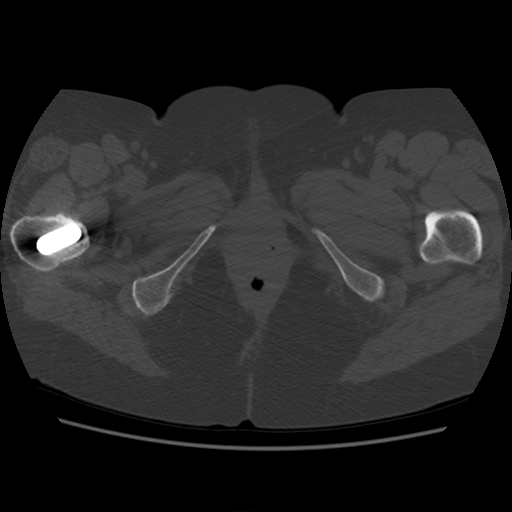
[im 18/88  soft-tissue]
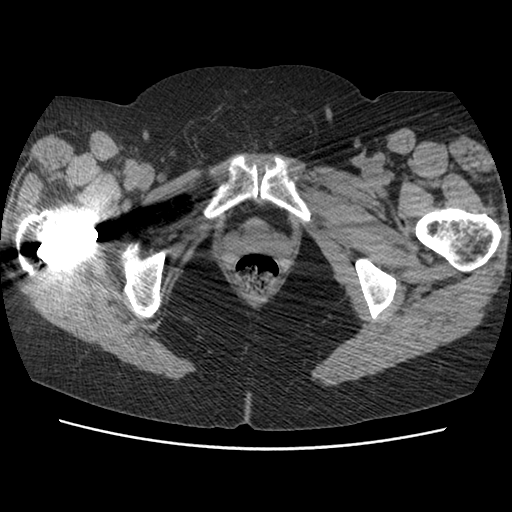
[im 27/88  soft-tissue]
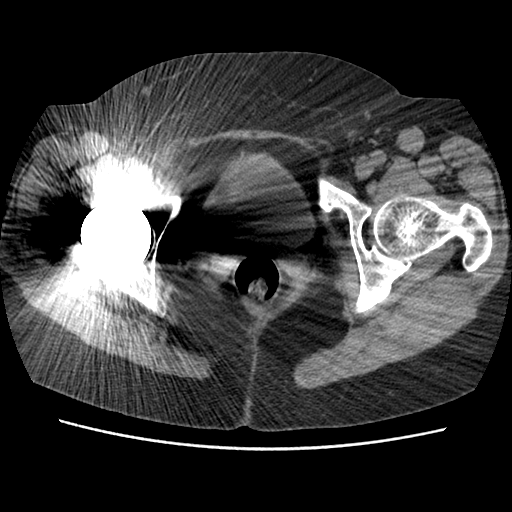
[im 35/88  soft-tissue]
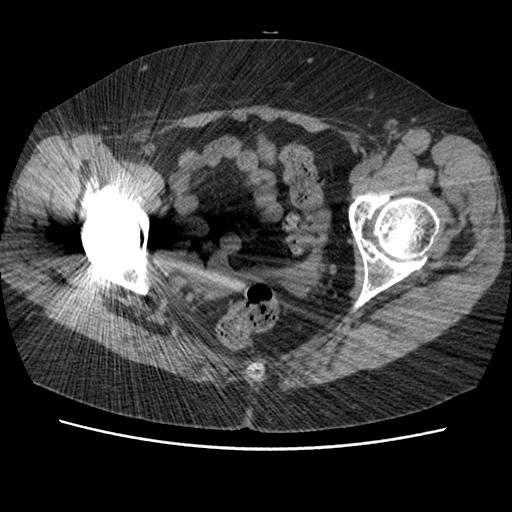
[im 44/88  soft-tissue]
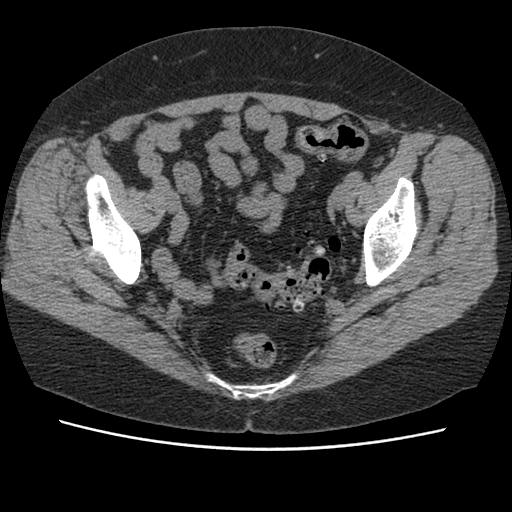
[im 53/88  soft-tissue]
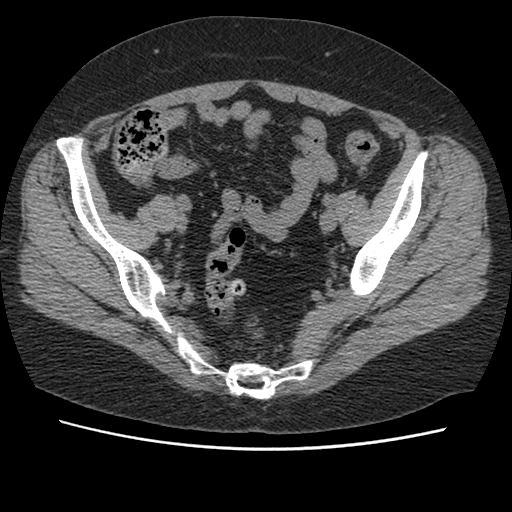
[im 53/88  lung]
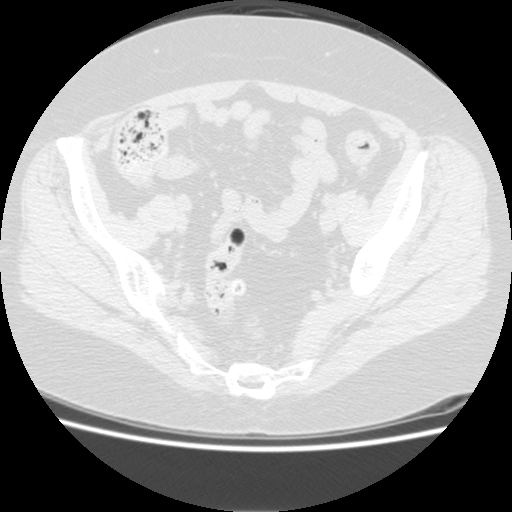
[im 61/88  soft-tissue]
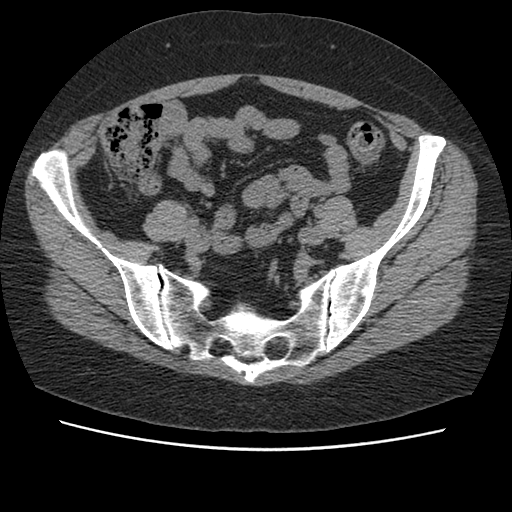
[im 61/88  lung]
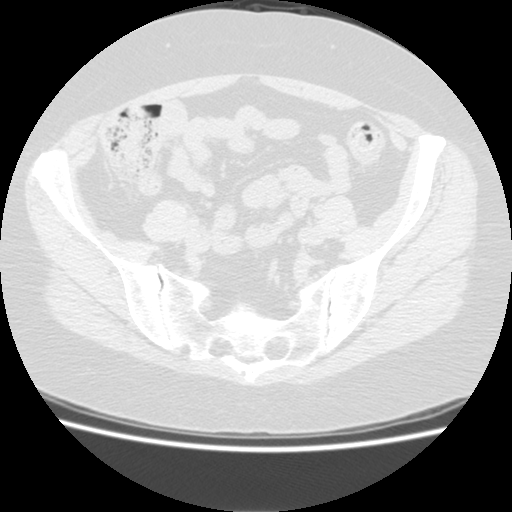
[im 70/88  soft-tissue]
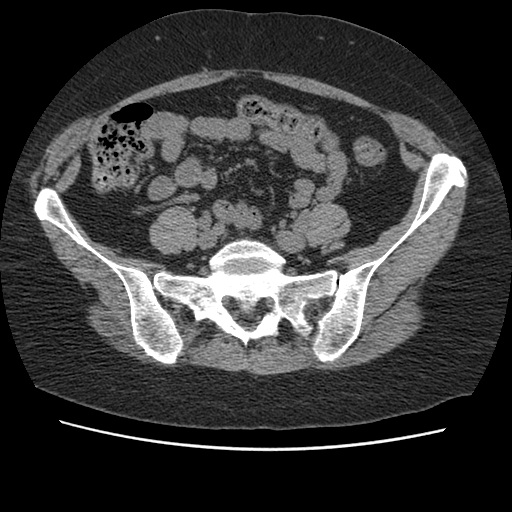
[im 70/88  lung]
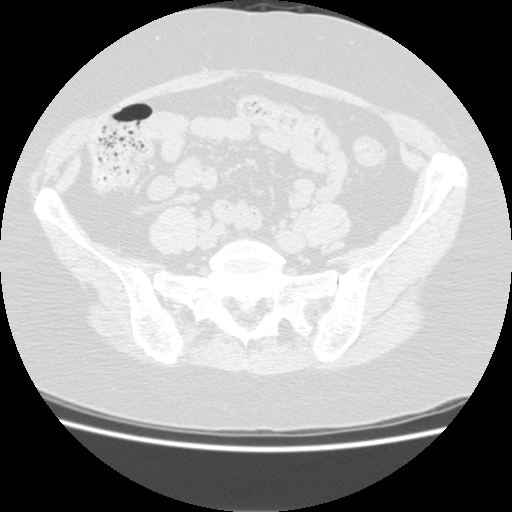
[im 79/88  soft-tissue]
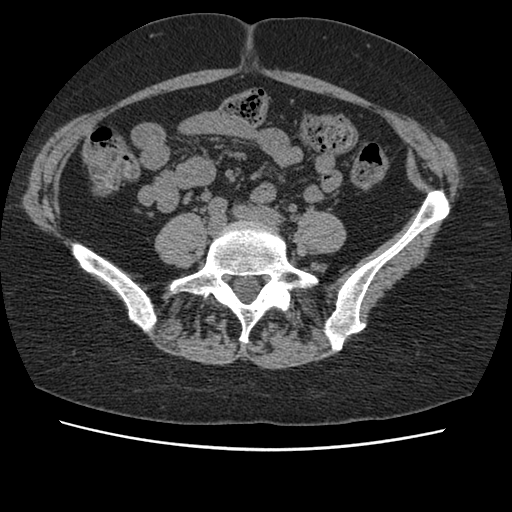
[im 79/88  lung]
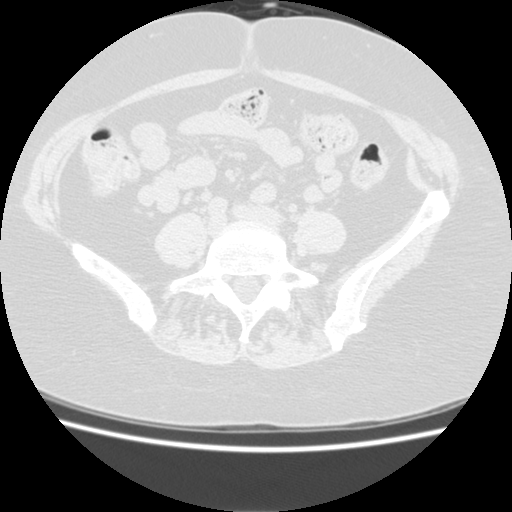
[im 79/88  bone]
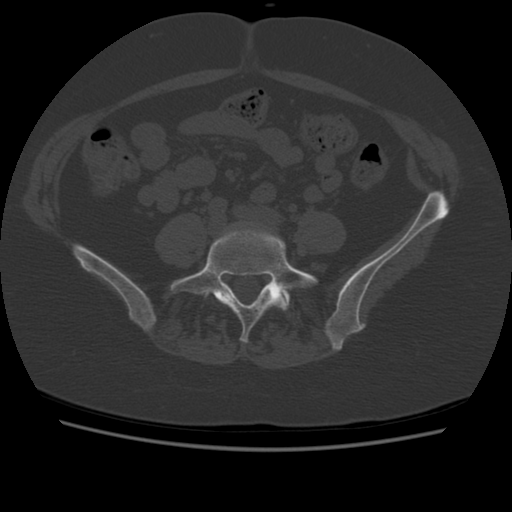

[Series 400: cor pelvis · coronal · 0.64mm/px · 3 of 110 slices shown]
[im 22/110  soft-tissue]
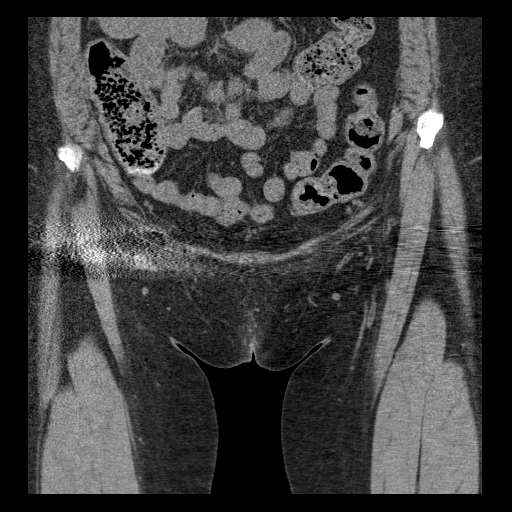
[im 44/110  soft-tissue]
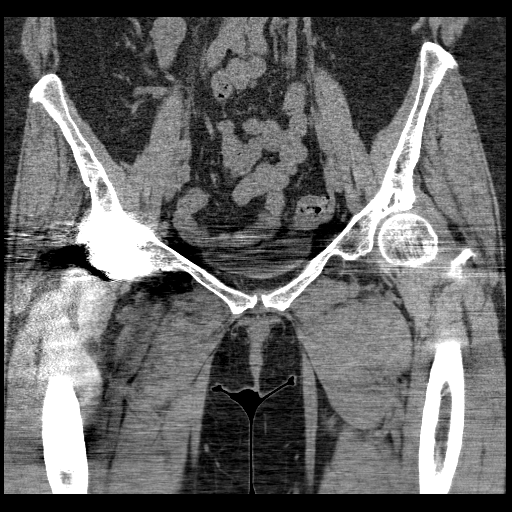
[im 66/110  soft-tissue]
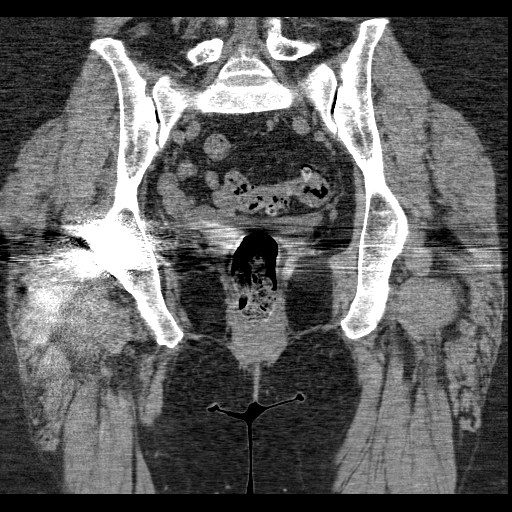

[Series 401: sag pelvis · sagittal · 0.64mm/px · 1 of 165 slices shown]
[im 55/165  soft-tissue]
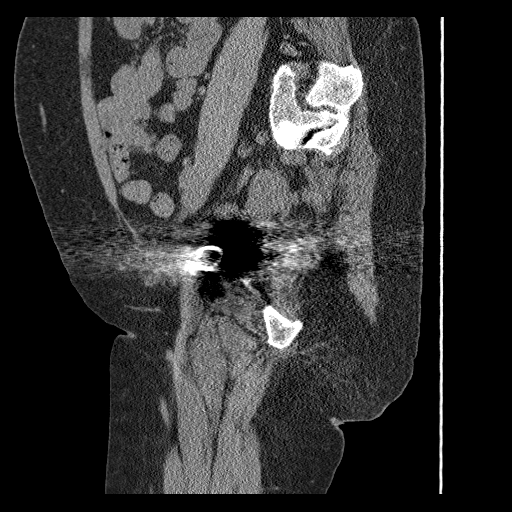

[13 of 46 positions shown; findings below may reference images not displayed]

FINDINGS: Patient is status post right total hip arthroplasty.
There is no evidence of hardware loosening.  A single acetabular
screw extending into the superior acetabulum breaches the lateral
cortex, but there is no surrounding soft tissue abnormality.
Acetabular cup alignment is normal.

There are mild degenerative changes of both sacroiliac joints and
the left hip.  Several subchondral cysts anteriorly in the left
acetabular roof are similar to the prior MRI.

Evaluation of the soft tissues immediately surrounding the right
hip are limited by artifact related to the hardware.  However,
there is no evidence of large hip joint effusion or bursal fluid
collection.  The periarticular musculature appears symmetric.  The
gluteus and hamstring tendons appear intact.  Sigmoid colon
diverticular changes are present.  The uterus is surgically absent.
IMPRESSION: 1.  No evidence of hardware loosening status post right hip
arthroplasty.
2.  No acute osseous findings.
3.  Stable degenerative changes of the left hip and both sacroiliac
joints.
4.  No right hip periarticular soft tissue abnormalities are
identified by CT.

## 2011-05-20 NOTE — H&P (Signed)
[name] is an 66 y.o. female.    Chief Complaint: left hip OA and pain   HPI: Pt is a 66 y.o. female complaining of left hip pain since Fall of 2012. She originally had a right total hip arthroplasty, posterior approach in 2009.  Pain of the left hip has continually increased since the beginning. X-rays in the clinic show end-stage arthritic changes of the left hip, including bone-on-bone changes. Pt has tried various conservative treatments which have failed to alleviate their symptoms, including NSAIDs which helped at first and became less and less effective. Various options are discussed with the patient. Risks, benefits and expectations were discussed with the patient. Patient understand the risks, benefits and expectations and wishes to proceed with surgery.   PCP:  Kristian Covey, MD, MD  D/C Plans:  Home with HHPT  Post-op Meds:  Rx given for ASA, Robaxin, Celebrex, Iron, Colace and MiraLax  Tranexamic Acid:  To be given  PMH: Past Medical History  Diagnosis Date  . Degenerative disc disease   . Osteoarthritis   . Hypertension   . Osteopenia   . Juvenile rheumatic fever     cardiac work up by dr. Daleen Squibb in 2010--negative--no heart prolems  . Blood transfusion     with hip replacement  . Kidney stones 2004    past hx-past hx --no stones now that pt aware of  . GERD (gastroesophageal reflux disease)     no recent prob and no meds  . Migraine headache     none in years    PSH: Past Surgical History  Procedure Date  . Rt hip arthroplasty total     01/14/2008  . Partial hysterectomy     partial sec to fibroids  . Kidney stones removed     11/12/2002  . Bartholin cyst surgery     Social History:  reports that she has never smoked. She has never used smokeless tobacco. She reports that she drinks alcohol. She reports that she does not use illicit drugs.  Allergies:  Allergies  Allergen Reactions  . Sulfonamide Derivatives Other (See Comments)    Childhood  reaction    Medications: No current facility-administered medications for this encounter.   Current Outpatient Prescriptions  Medication Sig Dispense Refill  . Bioflavonoid Products (ESTER-C) 500-50 MG TABS Take 1 tablet by mouth daily.       . Calcium-Magnesium 500-250 MG TABS Take 1 tablet by mouth daily.       . Cholecalciferol (VITAMIN D3) 2000 UNITS capsule Take 2,000 Units by mouth daily.       Marland Kitchen estradiol (CLIMARA - DOSED IN MG/24 HR) 0.05 mg/24hr Place 1 patch onto the skin once a week. Sundays.      . ferrous sulfate 325 (65 FE) MG tablet Take 325 mg by mouth daily.       . Milk Thistle-Turmeric (SILYMARIN) CAPS Take 1 capsule by mouth daily.       . Multiple Vitamin (MULITIVITAMIN WITH MINERALS) TABS Take 1 tablet by mouth daily.      . nabumetone (RELAFEN) 500 MG tablet Take 500 mg by mouth 2 (two) times daily as needed. For pain.      Marland Kitchen telmisartan-hydrochlorothiazide (MICARDIS HCT) 80-12.5 MG per tablet Take 1 tablet by mouth daily before breakfast.      . vitamin E 400 UNIT capsule Take 400-800 Units by mouth daily.         ROS: Review of Systems  Constitutional: Negative.   HENT:  Negative.   Eyes: Negative.   Respiratory: Negative.   Cardiovascular: Negative.   Gastrointestinal: Negative.   Genitourinary: Negative.   Musculoskeletal: Positive for joint pain (left hip).  Skin: Negative.   Neurological: Negative.   Endo/Heme/Allergies: Negative.   Psychiatric/Behavioral: Negative.      Physican Exam: Height 5' 3.5" (1.613 m), weight 152 lb (68.947 kg). Physical Exam  Constitutional: She is oriented to person, place, and time and well-developed, well-nourished, and in no distress.  HENT:  Head: Normocephalic and atraumatic.  Nose: Nose normal.  Mouth/Throat: Oropharynx is clear and moist.  Eyes: Pupils are equal, round, and reactive to light.  Neck: Neck supple. No JVD present. No tracheal deviation present. No thyromegaly present.  Cardiovascular: Normal  rate, regular rhythm, normal heart sounds and intact distal pulses.   Pulmonary/Chest: Effort normal and breath sounds normal. No stridor.  Abdominal: Soft. There is no tenderness. There is no guarding.  Musculoskeletal:       Left hip: She exhibits decreased range of motion, decreased strength, tenderness and bony tenderness. She exhibits no swelling, no crepitus, no deformity and no laceration.  Lymphadenopathy:    She has no cervical adenopathy.  Neurological: She is alert and oriented to person, place, and time.  Skin: Skin is warm and dry.  Psychiatric: Affect normal.     Assessment/Plan Assessment: left hip OA and pain   Plan: Patient will undergo a left total hip arthroplasty, anterior approach on 05/21/2011. Risks benefits and expectation were discussed with the patient. Patient understand risks, benefits and expectation and wishes to proceed.   Anastasio Auerbach Jamarquis Crull   PAC  05/20/2011, 4:46 PM

## 2011-05-21 ENCOUNTER — Ambulatory Visit (HOSPITAL_COMMUNITY): Payer: BC Managed Care – PPO

## 2011-05-21 ENCOUNTER — Encounter (HOSPITAL_COMMUNITY): Payer: Self-pay | Admitting: Anesthesiology

## 2011-05-21 ENCOUNTER — Inpatient Hospital Stay (HOSPITAL_COMMUNITY): Payer: BC Managed Care – PPO

## 2011-05-21 ENCOUNTER — Encounter (HOSPITAL_COMMUNITY): Payer: Self-pay | Admitting: *Deleted

## 2011-05-21 ENCOUNTER — Inpatient Hospital Stay (HOSPITAL_COMMUNITY)
Admission: RE | Admit: 2011-05-21 | Discharge: 2011-05-23 | DRG: 818 | Disposition: A | Discharge status: Home Health | Payer: BC Managed Care – PPO | Source: Ambulatory Visit | Attending: Orthopedic Surgery | Admitting: Orthopedic Surgery

## 2011-05-21 ENCOUNTER — Encounter (HOSPITAL_COMMUNITY)
Admission: RE | Disposition: A | Discharge status: Home Health | Payer: Self-pay | Source: Ambulatory Visit | Attending: Orthopedic Surgery

## 2011-05-21 ENCOUNTER — Ambulatory Visit (HOSPITAL_COMMUNITY): Payer: BC Managed Care – PPO | Admitting: Anesthesiology

## 2011-05-21 DIAGNOSIS — M1610 Unilateral primary osteoarthritis, unspecified hip: Principal | ICD-10-CM | POA: Diagnosis present

## 2011-05-21 DIAGNOSIS — M169 Osteoarthritis of hip, unspecified: Principal | ICD-10-CM | POA: Diagnosis present

## 2011-05-21 DIAGNOSIS — M899 Disorder of bone, unspecified: Secondary | ICD-10-CM | POA: Diagnosis present

## 2011-05-21 DIAGNOSIS — I10 Essential (primary) hypertension: Secondary | ICD-10-CM | POA: Diagnosis present

## 2011-05-21 DIAGNOSIS — K219 Gastro-esophageal reflux disease without esophagitis: Secondary | ICD-10-CM | POA: Diagnosis present

## 2011-05-21 DIAGNOSIS — Z96649 Presence of unspecified artificial hip joint: Secondary | ICD-10-CM

## 2011-05-21 DIAGNOSIS — M949 Disorder of cartilage, unspecified: Secondary | ICD-10-CM | POA: Diagnosis present

## 2011-05-21 HISTORY — DX: Calculus of kidney: N20.0

## 2011-05-21 HISTORY — DX: Encounter for other specified aftercare: Z51.89

## 2011-05-21 HISTORY — DX: Rheumatic fever without heart involvement: I00

## 2011-05-21 HISTORY — DX: Reserved for inherently not codable concepts without codable children: IMO0001

## 2011-05-21 HISTORY — PX: TOTAL HIP ARTHROPLASTY: SHX124

## 2011-05-21 LAB — CBC
Platelets: 340 10*3/uL (ref 150–400)
RBC: 4.2 MIL/uL (ref 3.87–5.11)
RDW: 12.6 % (ref 11.5–15.5)
WBC: 7.4 10*3/uL (ref 4.0–10.5)

## 2011-05-21 LAB — DIFFERENTIAL
Basophils Absolute: 0.1 10*3/uL (ref 0.0–0.1)
Lymphocytes Relative: 16 % (ref 12–46)
Lymphs Abs: 1.2 10*3/uL (ref 0.7–4.0)
Neutro Abs: 5.6 10*3/uL (ref 1.7–7.7)
Neutrophils Relative %: 75 % (ref 43–77)

## 2011-05-21 LAB — BASIC METABOLIC PANEL
CO2: 26 mEq/L (ref 19–32)
Calcium: 10.3 mg/dL (ref 8.4–10.5)
Chloride: 101 mEq/L (ref 96–112)
Potassium: 3.3 mEq/L — ABNORMAL LOW (ref 3.5–5.1)
Sodium: 138 mEq/L (ref 135–145)

## 2011-05-21 LAB — URINALYSIS, ROUTINE W REFLEX MICROSCOPIC
Hgb urine dipstick: NEGATIVE
Nitrite: NEGATIVE
Protein, ur: NEGATIVE mg/dL
Urobilinogen, UA: 0.2 mg/dL (ref 0.0–1.0)

## 2011-05-21 LAB — PROTIME-INR
INR: 1.05 (ref 0.00–1.49)
Prothrombin Time: 13.9 seconds (ref 11.6–15.2)

## 2011-05-21 LAB — APTT: aPTT: 29 seconds (ref 24–37)

## 2011-05-21 LAB — SURGICAL PCR SCREEN: Staphylococcus aureus: NEGATIVE

## 2011-05-21 LAB — TYPE AND SCREEN: ABO/RH(D): O POS

## 2011-05-21 SURGERY — ARTHROPLASTY, HIP, TOTAL, ANTERIOR APPROACH
Anesthesia: General | Site: Hip | Laterality: Left | Wound class: Clean

## 2011-05-21 MED ORDER — ADULT MULTIVITAMIN W/MINERALS CH
1.0000 | ORAL_TABLET | Freq: Every day | ORAL | Status: DC
  Administered 2011-05-21 – 2011-05-22 (×2): 1 via ORAL
  Filled 2011-05-21 (×3): qty 1

## 2011-05-21 MED ORDER — DROPERIDOL 2.5 MG/ML IJ SOLN
INTRAMUSCULAR | Status: DC | PRN
  Administered 2011-05-21: 0.625 mg via INTRAVENOUS

## 2011-05-21 MED ORDER — OLMESARTAN MEDOXOMIL 40 MG PO TABS
40.0000 mg | ORAL_TABLET | Freq: Every day | ORAL | Status: DC
  Administered 2011-05-23: 40 mg via ORAL
  Filled 2011-05-21 (×2): qty 1

## 2011-05-21 MED ORDER — ENOXAPARIN SODIUM 30 MG/0.3ML ~~LOC~~ SOLN
30.0000 mg | Freq: Two times a day (BID) | SUBCUTANEOUS | Status: DC
  Administered 2011-05-22 (×2): 30 mg via SUBCUTANEOUS
  Filled 2011-05-21 (×3): qty 0.3

## 2011-05-21 MED ORDER — DEXAMETHASONE SODIUM PHOSPHATE 10 MG/ML IJ SOLN: 10.0000 mg | Freq: Once | INTRAMUSCULAR | Status: DC

## 2011-05-21 MED ORDER — METOCLOPRAMIDE HCL 10 MG PO TABS: 5.0000 mg | ORAL_TABLET | Freq: Three times a day (TID) | ORAL | Status: DC | PRN

## 2011-05-21 MED ORDER — METHOCARBAMOL 100 MG/ML IJ SOLN
500.0000 mg | Freq: Four times a day (QID) | INTRAVENOUS | Status: DC | PRN
  Filled 2011-05-21: qty 5

## 2011-05-21 MED ORDER — MIDAZOLAM HCL 5 MG/5ML IJ SOLN
INTRAMUSCULAR | Status: DC | PRN
  Administered 2011-05-21: 2 mg via INTRAVENOUS

## 2011-05-21 MED ORDER — CEFAZOLIN SODIUM 1-5 GM-% IV SOLN
1.0000 g | INTRAVENOUS | Status: AC
  Administered 2011-05-21: 1 g via INTRAVENOUS

## 2011-05-21 MED ORDER — MEPERIDINE HCL 50 MG/ML IJ SOLN: 6.2500 mg | INTRAMUSCULAR | Status: DC | PRN

## 2011-05-21 MED ORDER — HYDROMORPHONE HCL PF 1 MG/ML IJ SOLN
INTRAMUSCULAR | Status: DC | PRN
  Administered 2011-05-21 (×2): 1 mg via INTRAVENOUS

## 2011-05-21 MED ORDER — METOCLOPRAMIDE HCL 5 MG/ML IJ SOLN
INTRAMUSCULAR | Status: DC | PRN
  Administered 2011-05-21: 10 mg via INTRAVENOUS

## 2011-05-21 MED ORDER — MENTHOL 3 MG MT LOZG
1.0000 | LOZENGE | OROMUCOSAL | Status: DC | PRN
  Filled 2011-05-21: qty 9

## 2011-05-21 MED ORDER — NEOSTIGMINE METHYLSULFATE 1 MG/ML IJ SOLN
INTRAMUSCULAR | Status: DC | PRN
  Administered 2011-05-21: 4 mg via INTRAVENOUS

## 2011-05-21 MED ORDER — ONDANSETRON HCL 4 MG/2ML IJ SOLN
INTRAMUSCULAR | Status: DC | PRN
  Administered 2011-05-21: 4 mg via INTRAVENOUS

## 2011-05-21 MED ORDER — CALCIUM-MAGNESIUM 500-250 MG PO TABS: 1.0000 | ORAL_TABLET | Freq: Every day | ORAL | Status: DC

## 2011-05-21 MED ORDER — ACETAMINOPHEN 10 MG/ML IV SOLN
INTRAVENOUS | Status: AC
  Filled 2011-05-21: qty 100

## 2011-05-21 MED ORDER — DIPHENHYDRAMINE HCL 12.5 MG/5ML PO ELIX: 12.5000 mg | ORAL_SOLUTION | ORAL | Status: DC | PRN

## 2011-05-21 MED ORDER — ALUM & MAG HYDROXIDE-SIMETH 200-200-20 MG/5ML PO SUSP: 30.0000 mL | ORAL | Status: DC | PRN

## 2011-05-21 MED ORDER — SODIUM CHLORIDE 0.9 % IV SOLN
INTRAVENOUS | Status: DC
  Filled 2011-05-21 (×3): qty 1000

## 2011-05-21 MED ORDER — PHENOL 1.4 % MT LIQD: 1.0000 | OROMUCOSAL | Status: DC | PRN

## 2011-05-21 MED ORDER — PROMETHAZINE HCL 25 MG/ML IJ SOLN: 6.2500 mg | INTRAMUSCULAR | Status: DC | PRN

## 2011-05-21 MED ORDER — ONDANSETRON HCL 4 MG/2ML IJ SOLN: 4.0000 mg | Freq: Four times a day (QID) | INTRAMUSCULAR | Status: DC | PRN

## 2011-05-21 MED ORDER — ESTRADIOL 0.05 MG/24HR TD PTWK: 0.0500 mg | MEDICATED_PATCH | TRANSDERMAL | Status: DC

## 2011-05-21 MED ORDER — POLYETHYLENE GLYCOL 3350 17 G PO PACK
17.0000 g | PACK | Freq: Two times a day (BID) | ORAL | Status: DC
  Administered 2011-05-21 – 2011-05-22 (×3): 17 g via ORAL
  Filled 2011-05-21 (×5): qty 1

## 2011-05-21 MED ORDER — CEFAZOLIN SODIUM 1-5 GM-% IV SOLN
INTRAVENOUS | Status: AC
  Filled 2011-05-21: qty 50

## 2011-05-21 MED ORDER — LACTATED RINGERS IV SOLN: INTRAVENOUS | Status: DC

## 2011-05-21 MED ORDER — HYDROCHLOROTHIAZIDE 12.5 MG PO CAPS
12.5000 mg | ORAL_CAPSULE | Freq: Every day | ORAL | Status: DC
  Administered 2011-05-23: 12.5 mg via ORAL
  Filled 2011-05-21 (×2): qty 1

## 2011-05-21 MED ORDER — FLEET ENEMA 7-19 GM/118ML RE ENEM: 1.0000 | ENEMA | Freq: Once | RECTAL | Status: AC | PRN

## 2011-05-21 MED ORDER — SCOPOLAMINE 1 MG/3DAYS TD PT72
MEDICATED_PATCH | TRANSDERMAL | Status: AC
  Filled 2011-05-21: qty 1

## 2011-05-21 MED ORDER — HYDROMORPHONE HCL PF 1 MG/ML IJ SOLN
0.5000 mg | INTRAMUSCULAR | Status: DC | PRN
  Administered 2011-05-22 – 2011-05-23 (×2): 1 mg via INTRAVENOUS
  Filled 2011-05-21 (×2): qty 1

## 2011-05-21 MED ORDER — FERROUS SULFATE 325 (65 FE) MG PO TABS
325.0000 mg | ORAL_TABLET | Freq: Three times a day (TID) | ORAL | Status: DC
  Administered 2011-05-21 – 2011-05-23 (×5): 325 mg via ORAL
  Filled 2011-05-21 (×7): qty 1

## 2011-05-21 MED ORDER — MAGNESIUM OXIDE 400 MG PO TABS
200.0000 mg | ORAL_TABLET | Freq: Every day | ORAL | Status: DC
  Administered 2011-05-21 – 2011-05-22 (×2): 200 mg via ORAL
  Filled 2011-05-21 (×4): qty 0.5

## 2011-05-21 MED ORDER — ROCURONIUM BROMIDE 100 MG/10ML IV SOLN
INTRAVENOUS | Status: DC | PRN
  Administered 2011-05-21: 50 mg via INTRAVENOUS

## 2011-05-21 MED ORDER — TRANEXAMIC ACID 100 MG/ML IV SOLN
15.0000 mg/kg | Freq: Once | INTRAVENOUS | Status: AC
  Administered 2011-05-21: 1034 mg via INTRAVENOUS
  Filled 2011-05-21 (×2): qty 10.34

## 2011-05-21 MED ORDER — METOCLOPRAMIDE HCL 5 MG/ML IJ SOLN: 5.0000 mg | Freq: Three times a day (TID) | INTRAMUSCULAR | Status: DC | PRN

## 2011-05-21 MED ORDER — CEFAZOLIN SODIUM 1-5 GM-% IV SOLN
1.0000 g | Freq: Four times a day (QID) | INTRAVENOUS | Status: AC
  Administered 2011-05-21 – 2011-05-22 (×3): 1 g via INTRAVENOUS
  Filled 2011-05-21 (×3): qty 50

## 2011-05-21 MED ORDER — SCOPOLAMINE 1 MG/3DAYS TD PT72
MEDICATED_PATCH | TRANSDERMAL | Status: DC | PRN
  Administered 2011-05-21: 1 via TRANSDERMAL

## 2011-05-21 MED ORDER — DEXAMETHASONE SODIUM PHOSPHATE 10 MG/ML IJ SOLN
INTRAMUSCULAR | Status: DC | PRN
  Administered 2011-05-21: 10 mg via INTRAVENOUS

## 2011-05-21 MED ORDER — METHOCARBAMOL 500 MG PO TABS
500.0000 mg | ORAL_TABLET | Freq: Four times a day (QID) | ORAL | Status: DC | PRN
  Administered 2011-05-21 – 2011-05-22 (×2): 500 mg via ORAL
  Filled 2011-05-21 (×4): qty 1

## 2011-05-21 MED ORDER — ZOLPIDEM TARTRATE 5 MG PO TABS: 5.0000 mg | ORAL_TABLET | Freq: Every evening | ORAL | Status: DC | PRN

## 2011-05-21 MED ORDER — GLYCOPYRROLATE 0.2 MG/ML IJ SOLN
INTRAMUSCULAR | Status: DC | PRN
  Administered 2011-05-21: .6 mg via INTRAVENOUS

## 2011-05-21 MED ORDER — TELMISARTAN-HCTZ 80-12.5 MG PO TABS: 1.0000 | ORAL_TABLET | Freq: Every day | ORAL | Status: DC

## 2011-05-21 MED ORDER — VITAMIN D3 25 MCG (1000 UNIT) PO TABS
2000.0000 [IU] | ORAL_TABLET | Freq: Every day | ORAL | Status: DC
  Administered 2011-05-21 – 2011-05-22 (×2): 2000 [IU] via ORAL
  Filled 2011-05-21 (×3): qty 2

## 2011-05-21 MED ORDER — HYDROMORPHONE HCL PF 1 MG/ML IJ SOLN
0.2500 mg | INTRAMUSCULAR | Status: DC | PRN
  Administered 2011-05-21 (×2): 0.5 mg via INTRAVENOUS

## 2011-05-21 MED ORDER — HYDROCODONE-ACETAMINOPHEN 7.5-325 MG PO TABS
1.0000 | ORAL_TABLET | ORAL | Status: DC | PRN
  Administered 2011-05-21 – 2011-05-22 (×5): 1 via ORAL
  Administered 2011-05-22: 2 via ORAL
  Administered 2011-05-22 – 2011-05-23 (×4): 1 via ORAL
  Filled 2011-05-21 (×2): qty 2
  Filled 2011-05-21 (×7): qty 1
  Filled 2011-05-21: qty 2

## 2011-05-21 MED ORDER — PROPOFOL 10 MG/ML IV EMUL
INTRAVENOUS | Status: DC | PRN
  Administered 2011-05-21: 150 mg via INTRAVENOUS

## 2011-05-21 MED ORDER — DOCUSATE SODIUM 100 MG PO CAPS
100.0000 mg | ORAL_CAPSULE | Freq: Two times a day (BID) | ORAL | Status: DC
  Administered 2011-05-21 – 2011-05-22 (×3): 100 mg via ORAL
  Filled 2011-05-21 (×5): qty 1

## 2011-05-21 MED ORDER — ACETAMINOPHEN 10 MG/ML IV SOLN
INTRAVENOUS | Status: DC | PRN
  Administered 2011-05-21: 1000 mg via INTRAVENOUS

## 2011-05-21 MED ORDER — LACTATED RINGERS IV SOLN
INTRAVENOUS | Status: DC
  Administered 2011-05-21 (×2): via INTRAVENOUS
  Administered 2011-05-21: 1000 mL via INTRAVENOUS

## 2011-05-21 MED ORDER — HYDROMORPHONE HCL PF 1 MG/ML IJ SOLN
INTRAMUSCULAR | Status: AC
  Filled 2011-05-21: qty 1

## 2011-05-21 MED ORDER — FENTANYL CITRATE 0.05 MG/ML IJ SOLN
INTRAMUSCULAR | Status: DC | PRN
  Administered 2011-05-21 (×2): 100 ug via INTRAVENOUS
  Administered 2011-05-21: 50 ug via INTRAVENOUS

## 2011-05-21 MED ORDER — ACETAMINOPHEN 325 MG PO TABS: 650.0000 mg | ORAL_TABLET | Freq: Four times a day (QID) | ORAL | Status: DC | PRN

## 2011-05-21 MED ORDER — CALCIUM CARBONATE 1250 (500 CA) MG PO TABS
1.0000 | ORAL_TABLET | Freq: Every day | ORAL | Status: DC
  Administered 2011-05-21 – 2011-05-22 (×2): 500 mg via ORAL
  Filled 2011-05-21 (×4): qty 1

## 2011-05-21 MED ORDER — ACETAMINOPHEN 650 MG RE SUPP: 650.0000 mg | Freq: Four times a day (QID) | RECTAL | Status: DC | PRN

## 2011-05-21 MED ORDER — ONDANSETRON HCL 4 MG PO TABS
4.0000 mg | ORAL_TABLET | Freq: Four times a day (QID) | ORAL | Status: DC | PRN
  Administered 2011-05-23: 4 mg via ORAL
  Filled 2011-05-21: qty 1

## 2011-05-21 MED ORDER — ESTRADIOL 0.05 MG/24HR TD PTWK
0.0500 mg | MEDICATED_PATCH | Freq: Once | TRANSDERMAL | Status: DC
  Administered 2011-05-21: 0.05 mg via TRANSDERMAL
  Filled 2011-05-21: qty 1

## 2011-05-21 MED ORDER — LIDOCAINE HCL (CARDIAC) 20 MG/ML IV SOLN
INTRAVENOUS | Status: DC | PRN
  Administered 2011-05-21: 100 mg via INTRAVENOUS

## 2011-05-21 MED ORDER — CHLORHEXIDINE GLUCONATE 4 % EX LIQD: 60.0000 mL | Freq: Once | CUTANEOUS | Status: DC

## 2011-05-21 SURGICAL SUPPLY — 41 items
ADH SKN CLS APL DERMABOND .7 (GAUZE/BANDAGES/DRESSINGS) ×1
BAG SPEC THK2 15X12 ZIP CLS (MISCELLANEOUS) ×2
BAG ZIPLOCK 12X15 (MISCELLANEOUS) ×4 IMPLANT
BLADE SAW SGTL 18X1.27X75 (BLADE) ×2 IMPLANT
CELLS DAT CNTRL 66122 CELL SVR (MISCELLANEOUS) ×1 IMPLANT
CLOTH BEACON ORANGE TIMEOUT ST (SAFETY) ×2 IMPLANT
DERMABOND ADVANCED (GAUZE/BANDAGES/DRESSINGS) ×1
DERMABOND ADVANCED .7 DNX12 (GAUZE/BANDAGES/DRESSINGS) ×1 IMPLANT
DRAPE C-ARM 42X72 X-RAY (DRAPES) ×2 IMPLANT
DRAPE STERI IOBAN 125X83 (DRAPES) ×2 IMPLANT
DRAPE U-SHAPE 47X51 STRL (DRAPES) ×6 IMPLANT
DRSG AQUACEL AG ADV 3.5X10 (GAUZE/BANDAGES/DRESSINGS) ×2 IMPLANT
DRSG TEGADERM 4X4.75 (GAUZE/BANDAGES/DRESSINGS) ×1 IMPLANT
DURAPREP 26ML APPLICATOR (WOUND CARE) ×2 IMPLANT
ELECT BLADE TIP CTD 4 INCH (ELECTRODE) ×2 IMPLANT
ELECT REM PT RETURN 9FT ADLT (ELECTROSURGICAL) ×2
ELECTRODE REM PT RTRN 9FT ADLT (ELECTROSURGICAL) ×1 IMPLANT
EVACUATOR 1/8 PVC DRAIN (DRAIN) IMPLANT
FACESHIELD LNG OPTICON STERILE (SAFETY) ×8 IMPLANT
GAUZE SPONGE 2X2 8PLY STRL LF (GAUZE/BANDAGES/DRESSINGS) ×1 IMPLANT
GLOVE BIOGEL PI IND STRL 7.5 (GLOVE) ×1 IMPLANT
GLOVE BIOGEL PI IND STRL 8 (GLOVE) ×1 IMPLANT
GLOVE BIOGEL PI INDICATOR 7.5 (GLOVE) ×1
GLOVE BIOGEL PI INDICATOR 8 (GLOVE) ×1
GLOVE ECLIPSE 8.0 STRL XLNG CF (GLOVE) ×2 IMPLANT
GLOVE ORTHO TXT STRL SZ7.5 (GLOVE) ×4 IMPLANT
GOWN BRE IMP PREV XXLGXLNG (GOWN DISPOSABLE) ×2 IMPLANT
GOWN STRL NON-REIN LRG LVL3 (GOWN DISPOSABLE) ×2 IMPLANT
KIT BASIN OR (CUSTOM PROCEDURE TRAY) ×2 IMPLANT
PACK TOTAL JOINT (CUSTOM PROCEDURE TRAY) ×2 IMPLANT
PADDING CAST COTTON 6X4 STRL (CAST SUPPLIES) ×2 IMPLANT
RETRACTOR WND ALEXIS 18 MED (MISCELLANEOUS) ×1 IMPLANT
RTRCTR WOUND ALEXIS 18CM MED (MISCELLANEOUS) ×2
SPONGE GAUZE 2X2 STER 10/PKG (GAUZE/BANDAGES/DRESSINGS) ×1
SUCTION FRAZIER 12FR DISP (SUCTIONS) ×2 IMPLANT
SUT MNCRL AB 4-0 PS2 18 (SUTURE) ×2 IMPLANT
SUT VIC AB 1 CT1 36 (SUTURE) ×8 IMPLANT
SUT VIC AB 2-0 CT1 27 (SUTURE) ×4
SUT VIC AB 2-0 CT1 TAPERPNT 27 (SUTURE) ×2 IMPLANT
TOWEL OR 17X26 10 PK STRL BLUE (TOWEL DISPOSABLE) ×4 IMPLANT
TRAY FOLEY CATH 14FRSI W/METER (CATHETERS) ×2 IMPLANT

## 2011-05-21 NOTE — Op Note (Signed)
NAME:  [name]                ACCOUNT NO.: [id number]      MEDICAL RECORD NO.: [id number]      FACILITY:  [facility]      PHYSICIAN:  Durene Romans D  DATE OF BIRTH:  [birth year]     DATE OF PROCEDURE:  05/21/2011                                 OPERATIVE REPORT         PREOPERATIVE DIAGNOSIS: Left  hip osteoarthritis.      POSTOPERATIVE DIAGNOSIS:  Left osteoarthritis.      PROCEDURE:  Left total hip replacement through an anterior approach   utilizing DePuy THR system, component size 52 pinnacle cup, a size 36+4 neutral   Altrex liner, a size 4Hi Tri Lock stem with a 36+1.5 delta ceramic   ball.      SURGEON:  Madlyn Frankel. Charlann Boxer, M.D.      ASSISTANT:  Lanney Gins, PA      ANESTHESIA:  General.      SPECIMENS:  None.      COMPLICATIONS:  None.      BLOOD LOSS:  300 cc     DRAINS:  One Hemovac.      INDICATION OF THE PROCEDURE:  [name] is a 66 y.o. female who had   presented to office for evaluation of left hip pain.  Radiographs revealed   progressive degenerative changes with near bone-on-bone   articulation to the  hip joint.  She had dealt with the same issues on her right hip  a couple years ago now and didn't want it to get as severe as it had gotten then. She had a painful, limited range of   motion significantly affecting their overall quality of life.  The patient was failing to    respond to conservative measures, and at this point was ready   to proceed with more definitive measures.  The patient has noted progressive   degenerative changes in his hip, progressive problems and dysfunction   with regarding the hip prior to surgery.  Consent was obtained for   benefit of pain relief.  Specific risk of infection, DVT, component   failure, dislocation, need for revision surgery, as well discussion of   the anterior versus posterior approach were reviewed.  Consent was   obtained for benefit of anterior pain relief through an anterior   approach.      PROCEDURE IN DETAIL:  The patient was brought to operative theater.   Once adequate anesthesia, preoperative antibiotics, 1gm Ancef administered.   The patient was positioned supine on the OSI Hanna table.  Once adequate   padding of boney process was carried out, we had predraped out the hip, and  used fluoroscopy to confirm orientation of the pelvis and position.      The left hip was then prepped and draped from proximal iliac crest to   mid thigh with shower curtain technique.      Time-out was performed identifying the patient, planned procedure, and   extremity.     An incision was then made 2 cm distal and lateral to the   anterior superior iliac spine extending over the orientation of the   tensor fascia lata muscle and sharp dissection was carried down to the   fascia of  the muscle and protractor placed in the soft tissues.      The fascia was then incised.  The muscle belly was identified and swept   laterally and retractor placed along the superior neck.  Following   cauterization of the circumflex vessels and removing some pericapsular   fat, a second cobra retractor was placed on the inferior neck.  A third   retractor was placed on the anterior acetabulum after elevating the   anterior rectus.  A L-capsulotomy was along the line of the   superior neck to the trochanteric fossa, then extended proximally and   distally.  Tag sutures were placed and the retractors were then placed   intracapsular.  We then identified the trochanteric fossa and   orientation of my neck cut, confirmed this radiographically   and then made a neck osteotomy with the femur on traction.  The femoral   head was removed without difficulty or complication.  Traction was let   off and retractors were placed posterior and anterior around the   acetabulum.      The labrum and foveal tissue were debrided.  I began reaming with a 47mm   reamer and reamed up to 51 reamer with good bony bed preparation  and a 52   cup was chosen.  The final 52 Pinnacle cup was then impacted under fluoroscopy  to confirm the depth of penetration and orientation with respect to   abduction.  A screw was placed followed by the hole eliminator.  The final   36+4 Altrex liner was impacted with good visualized rim fit.  The cup was positioned anatomically within the acetabular portion of the pelvis.  It appeared radiographically to be in the same location as her contralateral acetabular cup.       At this point, the femur was rolled at 80 degrees.  Further capsule was   released off the inferior aspect of the femoral neck.  I then   released the superior capsule proximally.  The hook was placed laterally   along the femur and elevated manually and held in position with the bed   hook.  The leg was then extended and adducted with the leg rolled to 100   degrees of external rotation.  Once the proximal femur was fully   exposed, I used a box osteotome to set orientation.  I then began   broaching with the starting chili pepper broach and passed this by hand and then broached up to 4.  With the 4 broach in place I chose a high offset neck and did a trial reduction.  The offset was appropriate, leg lengths   appeared to be equal, confirmed radiographically.   Given these findings, I went ahead and dislocated the hip, repositioned all   retractors and positioned the right hip in the extended and abducted position.  The final 4Hi  Tri Lock stem was   chosen and it was impacted down to the level of neck cut.  Based on this   and the trial reduction, a 36+1.5 delta ceramic ball was chosen and   impacted onto a clean and dry trunnion, and the hip was reduced.  The   hip had been irrigated throughout the case again at this point.  I did   reapproximate the superior capsular leaflet to the anterior leaflet   using #1 Vicryl, placed a medium Hemovac drain deep.  The fascia of the   tensor fascia lata muscle was then  reapproximated using #1 Vicryl.  The   remaining wound was closed with 2-0 Vicryl and running 4-0 Monocryl.   The hip was cleaned, dried, and dressed sterilely using Dermabond and   Aquacel dressing.  Drain site dressed separately.  She was then brought   to recovery room in stable condition tolerating the procedure well.    Lanney Gins, PA-C was present for the entirety of the case involved from   preoperative positioning, perioperative retractor management, general   facilitation of the case, as well as primary wound closure as assistant.            Madlyn Frankel Charlann Boxer, M.D.            MDO/MEDQ  D:  02/06/2011  T:  02/06/2011  Job:  [id number]      Electronically Signed by Durene Romans M.D. on 02/12/2011 09:15:38 AM

## 2011-05-21 NOTE — Anesthesia Preprocedure Evaluation (Addendum)
Anesthesia Evaluation  Patient identified by MRN, date of birth, ID band Patient awake    Reviewed: Allergy & Precautions, H&P , NPO status , Patient's Chart, lab work & pertinent test results  Airway Mallampati: II TM Distance: >3 FB Neck ROM: Full    Dental No notable dental hx.    Pulmonary neg pulmonary ROS,  clear to auscultation  Pulmonary exam normal       Cardiovascular hypertension, Pt. on medications neg cardio ROS - Valvular Problems/MurmursRegular Normal    Neuro/Psych Negative Neurological ROS  Negative Psych ROS   GI/Hepatic negative GI ROS, Neg liver ROS,   Endo/Other  Negative Endocrine ROS  Renal/GU negative Renal ROS  Genitourinary negative   Musculoskeletal negative musculoskeletal ROS (+)   Abdominal   Peds negative pediatric ROS (+)  Hematology negative hematology ROS (+)   Anesthesia Other Findings   Reproductive/Obstetrics negative OB ROS                           Anesthesia Physical Anesthesia Plan  ASA: II  Anesthesia Plan: General   Post-op Pain Management:    Induction: Intravenous  Airway Management Planned: Oral ETT  Additional Equipment:   Intra-op Plan:   Post-operative Plan: Extubation in OR  Informed Consent: I have reviewed the patients History and Physical, chart, labs and discussed the procedure including the risks, benefits and alternatives for the proposed anesthesia with the patient or authorized representative who has indicated his/her understanding and acceptance.   Dental advisory given  Plan Discussed with: CRNA  Anesthesia Plan Comments:        Anesthesia Quick Evaluation

## 2011-05-21 NOTE — Anesthesia Postprocedure Evaluation (Signed)
  Anesthesia Post-op Note  Patient: [name]  Procedure(s) Performed:  TOTAL HIP ARTHROPLASTY ANTERIOR APPROACH  Patient Location: PACU  Anesthesia Type: General  Level of Consciousness: awake and alert   Airway and Oxygen Therapy: Patient Spontanous Breathing  Post-op Pain: mild  Post-op Assessment: Post-op Vital signs reviewed, Patient's Cardiovascular Status Stable, Respiratory Function Stable, Patent Airway and No signs of Nausea or vomiting  Post-op Vital Signs: stable  Complications: No apparent anesthesia complications

## 2011-05-21 NOTE — Preoperative (Signed)
Beta Blockers   Reason not to administer Beta Blockers:Not Applicable 

## 2011-05-21 NOTE — Interval H&P Note (Signed)
History and Physical Interval Note:  05/21/2011 1:10 PM  [name]  has presented today for surgery, with the diagnosis of Osteoarthritis of the Left Hip  The various methods of treatment have been discussed with the patient and family. After consideration of risks, benefits and other options for treatment, the patient has consented to  Procedure(s): LEFT TOTAL HIP ARTHROPLASTY ANTERIOR APPROACH as a surgical intervention .  The patients' history has been reviewed, patient examined, no change in status, stable for surgery.  I have reviewed the patients' chart and labs.  Questions were answered to the patient's satisfaction.     Shelda Pal

## 2011-05-21 NOTE — Transfer of Care (Signed)
Immediate Anesthesia Transfer of Care Note  Patient: [name]  Procedure(s) Performed:  TOTAL HIP ARTHROPLASTY ANTERIOR APPROACH  Patient Location: PACU  Anesthesia Type: General  Level of Consciousness: awake, alert  and oriented  Airway & Oxygen Therapy: Patient Spontanous Breathing and Patient connected to face mask oxygen  Post-op Assessment: Report given to PACU RN and Post -op Vital signs reviewed and stable  Post vital signs: Reviewed and stable  Complications: No apparent anesthesia complications

## 2011-05-22 ENCOUNTER — Inpatient Hospital Stay (HOSPITAL_COMMUNITY): Admission: RE | Admit: 2011-05-22 | Payer: BC Managed Care – PPO | Source: Ambulatory Visit

## 2011-05-22 DIAGNOSIS — Z96649 Presence of unspecified artificial hip joint: Secondary | ICD-10-CM

## 2011-05-22 LAB — CBC
HCT: 27.5 % — ABNORMAL LOW (ref 36.0–46.0)
Hemoglobin: 9.4 g/dL — ABNORMAL LOW (ref 12.0–15.0)
MCH: 29.9 pg (ref 26.0–34.0)
MCHC: 34.2 g/dL (ref 30.0–36.0)

## 2011-05-22 LAB — BASIC METABOLIC PANEL
BUN: 13 mg/dL (ref 6–23)
Calcium: 9.7 mg/dL (ref 8.4–10.5)
GFR calc non Af Amer: >90 mL/min (ref >90)
Glucose, Bld: 126 mg/dL — ABNORMAL HIGH (ref 70–99)

## 2011-05-22 MED ORDER — RIVAROXABAN 10 MG PO TABS
10.0000 mg | ORAL_TABLET | Freq: Every day | ORAL | Status: DC
  Administered 2011-05-23: 10 mg via ORAL
  Filled 2011-05-22: qty 1

## 2011-05-22 MED FILL — Mupirocin Oint 2%: CUTANEOUS | Qty: 22 | Status: AC

## 2011-05-22 NOTE — Progress Notes (Signed)
Physical Therapy Treatment Patient Details Name: JOSCELINE CHENARD MRN: [id number] DOB: 03/02/1946 Today's Date: 05/22/2011  PT Assessment/Plan  PT - Assessment/Plan PT Plan: Discharge plan remains appropriate PT Frequency: 7X/week Recommendations for Other Services: OT consult Follow Up Recommendations: Home health PT Equipment Recommended: None recommended by PT PT Goals  Acute Rehab PT Goals PT Goal Formulation: With patient Time For Goal Achievement: 7 days Pt will go Supine/Side to Sit: with supervision PT Goal: Supine/Side to Sit - Progress: Progressing toward goal Pt will go Sit to Supine/Side: with supervision PT Goal: Sit to Supine/Side - Progress: Progressing toward goal Pt will go Sit to Stand: with supervision PT Goal: Sit to Stand - Progress: Progressing toward goal Pt will go Stand to Sit: with supervision PT Goal: Stand to Sit - Progress: Progressing toward goal Pt will Ambulate: 51 - 150 feet;with supervision;with rolling walker PT Goal: Ambulate - Progress: Progressing toward goal Pt will Go Up / Down Stairs: 3-5 stairs;with min assist;with least restrictive assistive device PT Goal: Up/Down Stairs - Progress: Progressing toward goal  PT Treatment Precautions/Restrictions  Restrictions Weight Bearing Restrictions: No Other Position/Activity Restrictions: WBAT Mobility (including Balance) Bed Mobility Supine to Sit: 4: Min assist Supine to Sit Details (indicate cue type and reason): cues for sequence/technique Sit to Supine: 4: Min assist Sit to Supine - Details (indicate cue type and reason): cues for sequence/technique Transfers Sit to Stand: 4: Min assist;From bed;With upper extremity assist Sit to Stand Details (indicate cue type and reason): cues for use of UEs and for LE position Stand to Sit: 4: Min assist;To bed;With upper extremity assist Stand to Sit Details: cues for use of UEs and for LE position Ambulation/Gait Ambulation/Gait Assistance: 4: Min  assist Ambulation/Gait Assistance Details (indicate cue type and reason): cues for sequence and position from RW Ambulation Distance (Feet): 275 Feet Assistive device: Rolling walker Gait Pattern: Step-to pattern;Step-through pattern Stairs: Yes Stairs Assistance: 4: Min assist Stairs Assistance Details (indicate cue type and reason): cues for sequence and foot/RW placement Stair Management Technique: Backwards;With walker Number of Stairs: 2     Exercise    End of Session PT - End of Session Activity Tolerance: Patient tolerated treatment well Patient left: in bed;with call bell in reach Nurse Communication: Mobility status for transfers;Mobility status for ambulation General Behavior During Session: [facility] for tasks performed Cognition: [facility] for tasks performed  Lottie Sigman 05/22/2011, 4:09 PM

## 2011-05-22 NOTE — Evaluation (Signed)
Physical Therapy Evaluation Patient Details Name: [name] MRN: [id number] DOB: 07-02-1945 Today's Date: 05/22/2011  Problem List:  Patient Active Problem List  Diagnoses  . MIGRAINE HEADACHE  . RHEUMATIC FEVER WITH HEART INVOLVEMENT  . RHEUMATIC HEART DISEASE  . HYPERTENSION, BENIGN  . GERD  . OSTEOARTHRITIS  . DEGENERATIVE DISC DISEASE  . OSTEOPENIA  . MURMUR  . ABDOMINAL PAIN, UNSPECIFIED SITE  . RENAL CALCULUS, HX OF  . S/P left hip replacement    Past Medical History:  Past Medical History  Diagnosis Date  . Degenerative disc disease   . Osteoarthritis   . Hypertension   . Osteopenia   . Juvenile rheumatic fever     cardiac work up by dr. Daleen Squibb in 2010--negative--no heart prolems  . Blood transfusion     with hip replacement  . Kidney stones 2004    past hx-past hx --no stones now that pt aware of  . GERD (gastroesophageal reflux disease)     no recent prob and no meds  . Migraine headache     none in years   Past Surgical History:  Past Surgical History  Procedure Date  . Rt hip arthroplasty total     01/14/2008  . Partial hysterectomy     partial sec to fibroids  . Kidney stones removed     11/12/2002  . Bartholin cyst surgery     PT Assessment/Plan/Recommendation PT Assessment Clinical Impression Statement: Pt wit L THR presents with decreased L LE strength/ROM and decreased functional mobility PT Recommendation/Assessment: Patient will need skilled PT in the acute care venue PT Problem List: Decreased strength;Decreased range of motion;Decreased activity tolerance;Decreased mobility;Decreased knowledge of use of DME;Pain PT Therapy Diagnosis : Difficulty walking PT Plan PT Frequency: 7X/week PT Treatment/Interventions: DME instruction;Gait training;Stair training;Functional mobility training;Therapeutic activities;Therapeutic exercise;Patient/family education PT Recommendation Recommendations for Other Services: OT consult Follow Up  Recommendations: Home health PT Equipment Recommended: None recommended by PT PT Goals  Acute Rehab PT Goals PT Goal Formulation: With patient Time For Goal Achievement: 7 days Pt will go Supine/Side to Sit: with supervision PT Goal: Supine/Side to Sit - Progress: Goal set today Pt will go Sit to Supine/Side: with supervision PT Goal: Sit to Supine/Side - Progress: Goal set today Pt will go Sit to Stand: with supervision PT Goal: Sit to Stand - Progress: Goal set today Pt will go Stand to Sit: with supervision PT Goal: Stand to Sit - Progress: Goal set today Pt will Ambulate: 51 - 150 feet;with supervision;with rolling walker PT Goal: Ambulate - Progress: Goal set today Pt will Go Up / Down Stairs: 3-5 stairs;with min assist;with least restrictive assistive device PT Goal: Up/Down Stairs - Progress: Goal set today  PT Evaluation Precautions/Restrictions  Restrictions Weight Bearing Restrictions: No Other Position/Activity Restrictions: WBAT Prior Functioning  Home Living Lives With: Spouse Receives Help From: Family Type of Home: House Home Layout: One level Home Access: Stairs to enter Entrance Stairs-Rails: None Entrance Stairs-Number of Steps: 4 Home Adaptive Equipment: Walker - rolling;Bedside commode/3-in-1 Prior Function Level of Independence: Independent with basic ADLs;Independent with transfers;Independent with gait Able to Take Stairs?: Yes Driving: Yes Cognition Cognition Arousal/Alertness: Awake/alert Overall Cognitive Status: Appears within functional limits for tasks assessed Orientation Level: Oriented X4 Sensation/Coordination Coordination Gross Motor Movements are Fluid and Coordinated: Yes Extremity Assessment RUE Assessment RUE Assessment: Within Functional Limits LUE Assessment LUE Assessment: Within Functional Limits RLE Assessment RLE Assessment: Within Functional Limits LLE Assessment LLE Assessment: Exceptions to [facility] (hip flex to  80, abd to  25) Mobility (including Balance) Bed Mobility Bed Mobility: Yes Supine to Sit: 3: Mod assist;4: Min assist Supine to Sit Details (indicate cue type and reason): cues for sequence Transfers Transfers: Yes Sit to Stand: 4: Min assist;3: Mod assist Sit to Stand Details (indicate cue type and reason): cues for use of UEs and for LE position Stand to Sit: 4: Min assist Stand to Sit Details: cues for use of UEs and for LE position Ambulation/Gait Ambulation/Gait: Yes Ambulation/Gait Assistance: 4: Min assist Ambulation/Gait Assistance Details (indicate cue type and reason): cues for sequence and position from RW Ambulation Distance (Feet): 100 Feet Assistive device: Rolling walker Gait Pattern: Step-to pattern    Exercise    End of Session PT - End of Session Activity Tolerance: Patient tolerated treatment well Patient left: in chair;with call bell in reach;with family/visitor present Nurse Communication: Mobility status for transfers;Mobility status for ambulation General Behavior During Session: [facility] for tasks performed Cognition: [facility] for tasks performed  [name] 05/22/2011, 12:08 PM

## 2011-05-22 NOTE — Progress Notes (Signed)
Subjective: 1 Day Post-Op Procedure(s) (LRB): TOTAL HIP ARTHROPLASTY ANTERIOR APPROACH (Left)   Patient reports pain as mild. No events. Feels much better after this THA, than her last one.  Objective:   VITALS:   Filed Vitals:   05/22/11 0552  BP: 99/59  Pulse: 80  Temp: 98.1 F (36.7 C)  Resp: 14    Neurovascular intact Dorsiflexion/Plantar flexion intact Incision: dressing C/D/I No cellulitis present Compartment soft  LABS  Basename 05/22/11 0352 05/21/11 1100  HGB 9.4* 12.8  HCT 27.5* 36.6  WBC 18.9* 7.4  PLT 297 340     Basename 05/22/11 0352 05/21/11 1100  NA 138 138  K 4.5 3.3*  BUN 13 18  CREATININE 0.62 0.75  GLUCOSE 126* 96     Assessment/Plan: 1 Day Post-Op Procedure(s) (LRB): TOTAL HIP ARTHROPLASTY ANTERIOR APPROACH (Left)   Foley cath d/c'ed HV drain d/c'ed Advance diet Up with therapy D/C IV fluids Plan for discharge tomorrow to home, if continues to do well   Anastasio Auerbach. Rollins Wrightson   PAC  05/22/2011, 8:02 AM

## 2011-05-22 NOTE — Progress Notes (Signed)
Occupational Therapy Note Chart reviewed. Spoke with patient who states she feels she will be ok with ADL at discharge. She is familiar with tasks from her previous hip surgery and has all DME. She reports it is going better after this THA than the last. Will sign off for OT per pt report of doing ok with ADL and having assist available if needed. [name] OTR/L [phone] 05/22/2011

## 2011-05-23 LAB — CBC
Hemoglobin: 8.7 g/dL — ABNORMAL LOW (ref 12.0–15.0)
MCH: 30.2 pg (ref 26.0–34.0)
MCHC: 33.9 g/dL (ref 30.0–36.0)
MCV: 89.2 fL (ref 78.0–100.0)
Platelets: 229 10*3/uL (ref 150–400)

## 2011-05-23 LAB — BASIC METABOLIC PANEL
Calcium: 9 mg/dL (ref 8.4–10.5)
Creatinine, Ser: 0.61 mg/dL (ref 0.50–1.10)
GFR calc non Af Amer: >90 mL/min (ref >90)
Glucose, Bld: 110 mg/dL — ABNORMAL HIGH (ref 70–99)
Sodium: 138 mEq/L (ref 135–145)

## 2011-05-23 MED ORDER — POLYETHYLENE GLYCOL 3350 17 G PO PACK: 17.0000 g | PACK | Freq: Two times a day (BID) | ORAL | Status: AC

## 2011-05-23 MED ORDER — METHOCARBAMOL 500 MG PO TABS: 500.0000 mg | ORAL_TABLET | Freq: Four times a day (QID) | ORAL | Status: AC | PRN

## 2011-05-23 MED ORDER — FERROUS SULFATE 325 (65 FE) MG PO TABS: 325.0000 mg | ORAL_TABLET | Freq: Three times a day (TID) | ORAL | Status: DC

## 2011-05-23 MED ORDER — ASPIRIN EC 325 MG PO TBEC: 325.0000 mg | DELAYED_RELEASE_TABLET | Freq: Two times a day (BID) | ORAL | Status: AC

## 2011-05-23 MED ORDER — PROMETHAZINE HCL 12.5 MG PO TABS: 12.5000 mg | ORAL_TABLET | Freq: Four times a day (QID) | ORAL | Status: AC | PRN

## 2011-05-23 MED ORDER — OXYCODONE HCL 5 MG PO TABS
5.0000 mg | ORAL_TABLET | ORAL | Status: DC
  Administered 2011-05-23: 10 mg via ORAL
  Filled 2011-05-23: qty 2

## 2011-05-23 MED ORDER — OXYCODONE HCL 5 MG PO TABS: 5.0000 mg | ORAL_TABLET | ORAL | Status: AC

## 2011-05-23 MED ORDER — ACETAMINOPHEN 325 MG PO TABS: 650.0000 mg | ORAL_TABLET | Freq: Four times a day (QID) | ORAL | Status: AC | PRN

## 2011-05-23 MED ORDER — DSS 100 MG PO CAPS: 100.0000 mg | ORAL_CAPSULE | Freq: Two times a day (BID) | ORAL | Status: AC

## 2011-05-23 NOTE — Progress Notes (Signed)
Subjective: 2 Days Post-Op Procedure(s) (LRB): TOTAL HIP ARTHROPLASTY ANTERIOR APPROACH (Left)   Patient reports pain as mild. Some episode of increased pain. Would like to increase pain meds to help control a little better. No other events.  Objective:   VITALS:   Filed Vitals:   05/23/11 0636  BP: 135/74  Pulse: 71  Temp: 98.6 F (37 C)  Resp: 18    Neurovascular intact Dorsiflexion/Plantar flexion intact Incision: dressing C/D/I No cellulitis present Compartment soft  LABS  Basename 05/23/11 0428 05/22/11 0352 05/21/11 1100  HGB 8.7* 9.4* 12.8  HCT 25.7* 27.5* 36.6  WBC 14.4* 18.9* 7.4  PLT 229 297 340     Basename 05/23/11 0428 05/22/11 0352 05/21/11 1100  NA 138 138 138  K 3.5 4.5 3.3*  BUN 12 13 18   CREATININE 0.61 0.62 0.75  GLUCOSE 110* 126* 96     Assessment/Plan: 2 Days Post-Op Procedure(s) (LRB): TOTAL HIP ARTHROPLASTY ANTERIOR APPROACH (Left)   Up with therapy Discharge home with home health Follow up 2 weeks.   Follow-up Information    Follow up with OLIN,Myleigh Amara D in 2 weeks.   Contact information:   [facility] [address], [address] [place] [address] [phone]         Anastasio Auerbach. Theresa Wedel   PAC  05/23/2011, 8:03 AM

## 2011-05-23 NOTE — Progress Notes (Signed)
Physical Therapy Treatment Patient Details Name: [name] MRN: [id number] DOB: [birth year] Today's Date: 05/23/2011  PT Assessment/Plan  PT - Assessment/Plan Comments on Treatment Session: Pt ltd by nausea this am but feels ready for d/c home with spouse PT Plan: Discharge plan remains appropriate PT Frequency: 7X/week Follow Up Recommendations: Home health PT Equipment Recommended: None recommended by PT PT Goals  Acute Rehab PT Goals PT Goal Formulation: With patient Time For Goal Achievement: 7 days Pt will go Supine/Side to Sit: with supervision PT Goal: Supine/Side to Sit - Progress: Met Pt will go Sit to Supine/Side: with supervision PT Goal: Sit to Supine/Side - Progress: Progressing toward goal Pt will go Sit to Stand: with supervision PT Goal: Sit to Stand - Progress: Met Pt will go Stand to Sit: with supervision PT Goal: Stand to Sit - Progress: Met Pt will Ambulate: 51 - 150 feet;with supervision;with rolling walker PT Goal: Ambulate - Progress: Met Pt will Go Up / Down Stairs: 3-5 stairs;with min assist;with least restrictive assistive device PT Goal: Up/Down Stairs - Progress: Met  PT Treatment Precautions/Restrictions  Restrictions Weight Bearing Restrictions: No Other Position/Activity Restrictions: WBAT Mobility (including Balance) Bed Mobility Supine to Sit: 5: Supervision Transfers Sit to Stand: 5: Supervision Sit to Stand Details (indicate cue type and reason): cues for use of UEs Stand to Sit: 5: Supervision Stand to Sit Details: cues for use of UEs Ambulation/Gait Ambulation/Gait Assistance: 5: Supervision Ambulation/Gait Assistance Details (indicate cue type and reason): cues for position from RW Ambulation Distance (Feet): 100 Feet (ltd by c/o nausea) Assistive device: Rolling walker Gait Pattern: Step-to pattern;Step-through pattern Stairs Assistance: 4: Min assist (husband assisting on descent) Stairs Assistance Details (indicate cue type and  reason): cues for sequence and foot/RW placement Stair Management Technique: Backwards;With walker Number of Stairs: 4     Exercise  Total Joint Exercises Ankle Circles/Pumps: AROM;15 reps;Supine;Both Gluteal Sets: AROM;10 reps;5 reps;Supine;Both Short Arc Quad: AROM;Both;5 reps;10 reps;Supine Heel Slides: AAROM;20 reps;Left;Supine Hip ABduction/ADduction: AAROM;15 reps;Supine;Left End of Session PT - End of Session Equipment Utilized During Treatment: Gait belt Activity Tolerance: Patient tolerated treatment well Patient left: in chair;with call bell in reach;with family/visitor present Nurse Communication: Mobility status for transfers;Mobility status for ambulation General Behavior During Session: [facility] for tasks performed Cognition: [facility] for tasks performed  [name] 05/23/2011, 12:03 PM

## 2011-05-23 NOTE — Progress Notes (Signed)
  CARE MANAGEMENT NOTE 05/23/2011  Patient:  [name], [name]   Account Number:  [id number]  Date Initiated:  05/22/2011  Documentation initiated by:  Colleen Can  Subjective/Objective Assessment:   DX OSTEOARTHRITIS LEFT HIP; TOTAL HIP REPLACEMNT     Action/Plan:   CM SPOKE WITH PATIENT AND PLANS ARE FOR PATIENT TO RETURN TO HER HOME IN [place] WHERE HER SPOUSE, DAUGHTER AND SISTER WILL BE CAREGIVERS. CHOICE OF HH AGENCIES OFFERED. PT CHOOSES [name] FOR [facility] SERVICES   Anticipated DC Date:  05/23/2011   Anticipated DC Plan:  HOME W HOME HEALTH SERVICES  In-house referral  NA      DC Planning Services  CM consult      [facility] Choice  HOME HEALTH   Choice offered to / List presented to:  C-1 Patient   DME arranged  NA      DME agency  NA     HH arranged  HH-2 PT      [facility] agency  [facility]   Status of service:  Completed, signed off Medicare Important Message given?  NA - LOS <3 / Initial given by admissions (If response is "NO", the following Medicare IM given date fields will be blank) Date Medicare IM given:   Date Additional Medicare IM given:    Discharge Disposition:  HOME W HOME HEALTH SERVICES  Per UR Regulation:    Comments:  lIST OF hh AGENCIES PLACED IN SHADOW CHART. PT FOR DISCHARGE TODAY. hh SERVICES TO START TOMORROW. PT WILL NEED RW AND [facility] WILL ARRANGE  FOR DME.

## 2011-05-23 NOTE — Discharge Summary (Signed)
Physician Discharge Summary  Patient ID: [name] MRN: [id number] DOB/AGE: May 28, 1945 66 y.o.  Admit date: 05/21/2011 Discharge date: 05/23/2011  Procedures:  Procedure(s) (LRB): TOTAL HIP ARTHROPLASTY ANTERIOR APPROACH (Left)  Attending Physician: Shelda Pal, MD   Admission Diagnoses: Left hip OA and pain   Discharge Diagnoses:  Principal Problem:  *S/P left hip replacement Degenerative disc disease   Osteoarthritis   Hypertension   Osteopenia   Juvenile rheumatic fever   Blood transfusion  GERD  Migraine headache    HPI: Pt is a 66 y.o. female complaining of left hip pain since Fall of 2012. She originally had a right total hip arthroplasty, posterior approach in 2009. Pain of the left hip has continually increased since the beginning. X-rays in the clinic show end-stage arthritic changes of the left hip, including bone-on-bone changes. Pt has tried various conservative treatments which have failed to alleviate their symptoms, including NSAIDs which helped at first and became less and less effective. Various options are discussed with the patient. Risks, benefits and expectations were discussed with the patient. Patient understand the risks, benefits and expectations and wishes to proceed with surgery.  PCP: Kristian Covey, MD, MD   Discharged Condition: good  Hospital Course:  Patient underwent the above stated procedure on 05/21/2011. Patient tolerated the procedure well and brought to the recovery room in good condition and subsequently to the floor.  POD #1 BP: 99/59 ; Pulse: 80 ; Temp: 98.1 F (36.7 C) ; Resp: 14 Pt's foley was removed, as well as the hemovac drain removed. IV was changed to a saline lock. Patient reports pain as mild. No events. Feels much better after this THA, than her last one. Neurovascular intact, dorsiflexion/plantar flexion intact, incision: dressing C/D/I, no cellulitis present and compartment soft.  LABS  Basename  05/22/11 0352      HGB  9.4*    HCT  27.5*     POD #2  BP: 135/74 ; Pulse: 71 ; Temp: 98.6 F (37 C) ; Resp: 18 Patient reports pain as mild. Some episode of increased pain. Would like to increase pain meds to help control a little better. No other events. Feels ready to be discharged home. Neurovascular intact, dorsiflexion/plantar flexion intact, incision: dressing C/D/I, no cellulitis present and compartment soft.  LABS  Basename  05/23/11 0428     HGB  8.7*     HCT  25.7*       Discharge Exam: Extremities: Homans sign is negative, no sign of DVT, no edema, redness or tenderness in the calves or thighs and no ulcers, gangrene or trophic changes  Disposition:  Home with HHPT,  with follow up in 2 weeks  Follow-up Information    Follow up with OLIN,Adjoa Althouse D in 2 weeks.   Contact information:   [facility] [address], [address] [place] [address] [phone]          Discharge Orders    Future Orders Please Complete By Expires   Diet - low sodium heart healthy      Call MD / Call 911      Comments:   If you experience chest pain or shortness of breath, CALL 911 and be transported to the hospital emergency room.  If you develope a fever above 101 F, pus (white drainage) or increased drainage or redness at the wound, or calf pain, call your surgeon's office.   Discharge instructions      Comments:   Maintain surgical  dressing for 8 days, then replace with gauze and tape. Keep the area dry and clean until follow up. Follow up in 2 weeks at [facility]. Call with any questions or concerns.     Constipation Prevention      Comments:   Drink plenty of fluids.  Prune juice may be helpful.  You may use a stool softener, such as Colace (over the counter) 100 mg twice a day.  Use MiraLax (over the counter) for constipation as needed.   Increase activity slowly as tolerated      Weight Bearing as taught in Physical Therapy      Comments:   Use a  walker or crutches as instructed.   Driving restrictions      Comments:   No driving for 4 weeks   Change dressing      Comments:   Maintain surgical dressing for 8 days, then replace with 4x4 guaze and tape. Keep the area dry and clean.   TED hose      Comments:   Use stockings (TED hose) for 2 weeks on both leg(s).  You may remove them at night for sleeping.      Current Discharge Medication List    START taking these medications   Details  acetaminophen (TYLENOL) 325 MG tablet Take 2 tablets (650 mg total) by mouth every 6 (six) hours as needed (or Fever >/= 101).    aspirin EC 325 MG tablet Take 1 tablet (325 mg total) by mouth 2 (two) times daily. X 4 weeks Qty: 60 tablet, Refills: 0    docusate sodium 100 MG CAPS Take 100 mg by mouth 2 (two) times daily.    methocarbamol (ROBAXIN) 500 MG tablet Take 1 tablet (500 mg total) by mouth every 6 (six) hours as needed (muscle spasms).    oxyCODONE (OXY IR/ROXICODONE) 5 MG immediate release tablet Take 1-2 tablets (5-10 mg total) by mouth every 4 (four) hours. Qty: 100 tablet, Refills: 0    polyethylene glycol (MIRALAX / GLYCOLAX) packet Take 17 g by mouth 2 (two) times daily.    promethazine (PHENERGAN) 12.5 MG tablet Take 1 tablet (12.5 mg total) by mouth every 6 (six) hours as needed for nausea. Qty: 30 tablet, Refills: 0      CONTINUE these medications which have CHANGED   Details  ferrous sulfate 325 (65 FE) MG tablet Take 1 tablet (325 mg total) by mouth 3 (three) times daily after meals.      CONTINUE these medications which have NOT CHANGED   Details  Bioflavonoid Products (ESTER-C) 500-50 MG TABS Take 1 tablet by mouth daily.     Calcium-Magnesium 500-250 MG TABS Take 1 tablet by mouth daily.     Cholecalciferol (VITAMIN D3) 2000 UNITS capsule Take 2,000 Units by mouth daily.     estradiol (CLIMARA - DOSED IN MG/24 HR) 0.05 mg/24hr Place 1 patch onto the skin once a week. Sundays.    Milk Thistle-Turmeric  (SILYMARIN) CAPS Take 1 capsule by mouth daily.     Multiple Vitamin (MULITIVITAMIN WITH MINERALS) TABS Take 1 tablet by mouth daily.    nabumetone (RELAFEN) 500 MG tablet Take 500 mg by mouth 2 (two) times daily as needed. For pain.    telmisartan-hydrochlorothiazide (MICARDIS HCT) 80-12.5 MG per tablet Take 1 tablet by mouth daily before breakfast.    vitamin E 400 UNIT capsule Take 400-800 Units by mouth daily.           Signed: Anastasio Auerbach. Yasira Engelson  PAC  05/23/2011, 9:23 AM

## 2011-06-14 ENCOUNTER — Encounter (HOSPITAL_COMMUNITY): Payer: Self-pay | Admitting: Orthopedic Surgery

## 2011-11-02 ENCOUNTER — Ambulatory Visit (INDEPENDENT_AMBULATORY_CARE_PROVIDER_SITE_OTHER): Payer: BC Managed Care – PPO | Admitting: Family Medicine

## 2011-11-02 ENCOUNTER — Encounter: Payer: Self-pay | Admitting: Family Medicine

## 2011-11-02 VITALS — BP 140/84 | Temp 98.2°F | Wt 162.0 lb

## 2011-11-02 DIAGNOSIS — I10 Essential (primary) hypertension: Secondary | ICD-10-CM

## 2011-11-02 DIAGNOSIS — E663 Overweight: Secondary | ICD-10-CM

## 2011-11-02 NOTE — Progress Notes (Signed)
  Subjective:    Patient ID: [name], female    DOB: 06-06-1945, 66 y.o.   MRN: [id number]  HPI  Patient here to discuss possible weight loss options. She specifically requests trial of Belviq.  She brings in a pamphlet today. Her BMI is 28. She has comorbidities of hypertension and also a history of GERD. She had recent hip replacement that went well. She has no cardiac history. No serotonin medication use. She's tried walking and various diets without much improvement. She has no history of diabetes. No history of cardiac valve problems. Marginal control of blood pressure.   Review of Systems  Constitutional: Negative for appetite change, fatigue and unexpected weight change.  Respiratory: Negative for shortness of breath and wheezing.   Cardiovascular: Negative for chest pain, palpitations and leg swelling.  Neurological: Negative for dizziness and headaches.       Objective:   Physical Exam  Constitutional: She appears well-developed and well-nourished.  Neck: Neck supple. No thyromegaly present.  Cardiovascular: Normal rate and regular rhythm.   Pulmonary/Chest: Effort normal and breath sounds normal. No respiratory distress. She has no wheezes. She has no rales.          Assessment & Plan:  Patient is overweight with BMI of 28 and hypertension. She is specifically requesting trial of Belviq.  She denies any contraindications but we explained this is a relatively new medication and we do not know of potential long-term risks including potential for valvular heart disorders and pulmonary hypertension, among others. She has read extensively on this and is willing to take those risks. She has no history of diabetes. Does not take any other serotonin medications. Start 10 mg twice a day and reassess one month. Check CBC then.  If not > %5 weight loss in 3 months will D/C.

## 2011-12-03 ENCOUNTER — Encounter: Payer: Self-pay | Admitting: Family Medicine

## 2011-12-03 ENCOUNTER — Ambulatory Visit (INDEPENDENT_AMBULATORY_CARE_PROVIDER_SITE_OTHER): Payer: BC Managed Care – PPO | Admitting: Family Medicine

## 2011-12-03 VITALS — BP 120/80 | HR 68 | Temp 98.2°F | Wt 156.0 lb

## 2011-12-03 DIAGNOSIS — Z79899 Other long term (current) drug therapy: Secondary | ICD-10-CM

## 2011-12-03 DIAGNOSIS — I10 Essential (primary) hypertension: Secondary | ICD-10-CM

## 2011-12-03 LAB — CBC WITH DIFFERENTIAL/PLATELET
Basophils Absolute: 0.1 10*3/uL (ref 0.0–0.1)
Basophils Relative: 0.9 % (ref 0.0–3.0)
Eosinophils Absolute: 0.2 10*3/uL (ref 0.0–0.7)
Hemoglobin: 13.8 g/dL (ref 12.0–15.0)
MCHC: 34.1 g/dL (ref 30.0–36.0)
MCV: 90.9 fl (ref 78.0–100.0)
Monocytes Absolute: 0.6 10*3/uL (ref 0.1–1.0)
Neutro Abs: 5 10*3/uL (ref 1.4–7.7)
Neutrophils Relative %: 70.5 % (ref 43.0–77.0)
RBC: 4.46 Mil/uL (ref 3.87–5.11)
RDW: 12.6 % (ref 11.5–14.6)

## 2011-12-03 NOTE — Progress Notes (Signed)
  Subjective:    Patient ID: [name], female    DOB: 10/28/1945, 66 y.o.   MRN: [id number]  HPI  Patient here for followup after initiation of Belviq.  Patient had been trying to lose weight with dietary changes and regular exercise-without much success. She has comorbidities of hypertension and GERD. She's tolerating well. She's not taking any serotonin reuptake inhibitor medications. She is aware that we do not have long-term studies with this appetite suppressant and is aware of potential complications including pulmonary hypertension and valvular heart problems. She has already lost 6 pounds and feels well overall. No dyspnea. No chest pain.   Review of Systems  Constitutional: Negative for fever and unexpected weight change.  Respiratory: Negative for cough and shortness of breath.   Cardiovascular: Negative for chest pain.  Neurological: Negative for headaches.       Objective:   Physical Exam  Constitutional: She appears well-developed and well-nourished.  Cardiovascular: Normal rate and regular rhythm.  Exam reveals no gallop.   Pulmonary/Chest: Effort normal and breath sounds normal. No respiratory distress. She has no wheezes. She has no rales.  Musculoskeletal: She exhibits no edema.          Assessment & Plan:  Overweight. Patient has lost 6 pounds with medication above. Check CBC. Recheck in 2 months. By package insert, needs to lose 5% body weight in 3 months to maintain medication. She is again made aware of no data on long term use of this medication and she is willing to accept those risks.

## 2011-12-05 NOTE — Progress Notes (Signed)
Quick Note:  Left a message for pt to return call. ______ 

## 2011-12-06 NOTE — Progress Notes (Signed)
Quick Note:  Spoke with pt and pt is aware. ______ 

## 2012-01-02 ENCOUNTER — Other Ambulatory Visit: Payer: Self-pay | Admitting: Family Medicine

## 2012-02-04 ENCOUNTER — Ambulatory Visit: Payer: BC Managed Care – PPO | Admitting: Family Medicine

## 2012-02-11 ENCOUNTER — Ambulatory Visit (INDEPENDENT_AMBULATORY_CARE_PROVIDER_SITE_OTHER): Payer: Self-pay | Admitting: Family Medicine

## 2012-02-11 ENCOUNTER — Encounter: Payer: Self-pay | Admitting: Family Medicine

## 2012-02-11 VITALS — BP 130/74 | Temp 97.8°F | Ht 63.0 in | Wt 150.0 lb

## 2012-02-11 DIAGNOSIS — E663 Overweight: Secondary | ICD-10-CM

## 2012-02-11 DIAGNOSIS — I10 Essential (primary) hypertension: Secondary | ICD-10-CM

## 2012-02-11 NOTE — Progress Notes (Signed)
  Subjective:    Patient ID: [name], female    DOB: 08-11-1945, 66 y.o.   MRN: [id number]  HPI  Patient seen for followup regarding hypertension and overweight. Refer to prior note. Patient had requested trial of Belviq which she has taken now for almost 3 months. She did not have any contraindications. Though her BMI was less than 30 she had comorbidities of hypertension and GERD. She's done extremely well. No side effects. Starting weight 162 pounds currently 150 pounds. She's had greater than 5% loss of weight over the first 3 months. Recent CBC normal. Blood pressure has improved as her weight has reduced. She's trying to walk regularly. No chest pains or dyspnea.   Review of Systems  Respiratory: Negative for shortness of breath.   Cardiovascular: Negative for chest pain, palpitations and leg swelling.  Gastrointestinal: Negative for abdominal pain.  Neurological: Negative for dizziness and headaches.       Objective:   Physical Exam  Constitutional: She appears well-developed and well-nourished.  Neck: Neck supple. No thyromegaly present.  Cardiovascular: Normal rate and regular rhythm.   No murmur heard. Pulmonary/Chest: Effort normal and breath sounds normal. No respiratory distress. She has no wheezes. She has no rales.  Musculoskeletal: She exhibits no edema.          Assessment & Plan:  #1 hypertension. Improving with weight loss  #2 overweight. Patient has had good weight loss with Belviq with no adverse side effects. We'll plan routine followup in 3 months to reassess

## 2012-04-07 ENCOUNTER — Other Ambulatory Visit: Payer: Self-pay | Admitting: *Deleted

## 2012-04-07 MED ORDER — LORCASERIN HCL 10 MG PO TABS: 10.0000 mg | ORAL_TABLET | Freq: Two times a day (BID) | ORAL | Status: DC

## 2012-05-12 ENCOUNTER — Ambulatory Visit: Payer: Self-pay | Admitting: Family Medicine

## 2012-05-30 ENCOUNTER — Ambulatory Visit: Payer: Self-pay | Admitting: Family Medicine

## 2012-06-05 ENCOUNTER — Ambulatory Visit (INDEPENDENT_AMBULATORY_CARE_PROVIDER_SITE_OTHER): Payer: BC Managed Care – PPO | Admitting: Family Medicine

## 2012-06-05 ENCOUNTER — Encounter: Payer: Self-pay | Admitting: Family Medicine

## 2012-06-05 VITALS — BP 142/82 | Temp 98.5°F | Wt 147.0 lb

## 2012-06-05 DIAGNOSIS — I10 Essential (primary) hypertension: Secondary | ICD-10-CM

## 2012-06-05 NOTE — Progress Notes (Signed)
  Subjective:    Patient ID: [name], female    DOB: 1945-09-23, 67 y.o.   MRN: [id number]  HPI Patient seen for followup regarding hypertension and weight loss efforts. Several months ago she started Belviq and she is done extremely well in combination with dietary change and exercise. She's lost about 15 pounds. We expressed our initial reservations because of lack of long-term studies with this but she is aware there may be risks with long-term use. She's had absolutely no adverse effects and feels great. Her GERD symptoms are improved. Last visit CBC normal.  Hypertension treated with Micardis HCTZ. Her gynecologist has been prescribing this for years and he has just recently retired.  She is asking that we take this over. Her recent blood pressures at home consistently around 130/80. No dizziness. No chest pains. No peripheral edema issues.  Past Medical History  Diagnosis Date  . Degenerative disc disease   . Osteoarthritis   . Hypertension   . Osteopenia   . Juvenile rheumatic fever     cardiac work up by dr. Daleen Squibb in 2010--negative--no heart prolems  . Blood transfusion     with hip replacement  . Kidney stones 2004    past hx-past hx --no stones now that pt aware of  . GERD (gastroesophageal reflux disease)     no recent prob and no meds  . Migraine headache     none in years   Past Surgical History  Procedure Laterality Date  . Rt hip arthroplasty total      01/14/2008  . Partial hysterectomy      partial sec to fibroids  . Kidney stones removed      11/12/2002  . Bartholin cyst surgery    . Total hip arthroplasty  05/21/2011    Procedure: TOTAL HIP ARTHROPLASTY ANTERIOR APPROACH;  Surgeon: Shelda Pal, MD;  Location: [facility];  Service: Orthopedics;  Laterality: Left;    reports that she has never smoked. She has never used smokeless tobacco. She reports that  drinks alcohol. She reports that she does not use illicit drugs. family history includes Aneurysm in her  other and COPD in her other. Allergies  Allergen Reactions  . Sulfonamide Derivatives Other (See Comments)    Childhood reaction      Review of Systems  Constitutional: Negative for fatigue.  Eyes: Negative for visual disturbance.  Respiratory: Negative for cough, chest tightness, shortness of breath and wheezing.   Cardiovascular: Negative for chest pain, palpitations and leg swelling.  Neurological: Negative for dizziness, seizures, syncope, weakness, light-headedness and headaches.       Objective:   Physical Exam  Constitutional: She appears well-developed and well-nourished.  Neck: Neck supple. No thyromegaly present.  Cardiovascular: Normal rate and regular rhythm.   No murmur heard. Pulmonary/Chest: Effort normal and breath sounds normal. No respiratory distress. She has no wheezes. She has no rales.  Musculoskeletal: She exhibits no edema.  Lymphadenopathy:    She has no cervical adenopathy.          Assessment & Plan:  #1 hypertension. Stable. Continue close monitoring.  #2 overweight. Continue with medication above. She has tolerated well with good success. We discussed as she gets closer her target rate which is around 140 at some point try to taper off this and she agrees with this plan. Routine followup in 4 months

## 2012-07-08 ENCOUNTER — Other Ambulatory Visit: Payer: Self-pay | Admitting: Family Medicine

## 2012-10-07 ENCOUNTER — Other Ambulatory Visit: Payer: Self-pay | Admitting: Family Medicine

## 2012-10-07 NOTE — Telephone Encounter (Signed)
Rx request phoned to pharmacy/SLS  

## 2012-10-07 NOTE — Telephone Encounter (Signed)
Request for Belviq Last Rx: 12.23.13,[ #180x3], Controlled Substance--Rx only good for 6-mths Last OV: 02.20.14 Return: 4-Months Please advise on refills/SLS

## 2012-10-07 NOTE — Telephone Encounter (Signed)
Refill for 3 months. 

## 2012-10-13 ENCOUNTER — Encounter: Payer: Self-pay | Admitting: Family Medicine

## 2012-10-13 ENCOUNTER — Ambulatory Visit (INDEPENDENT_AMBULATORY_CARE_PROVIDER_SITE_OTHER): Payer: Medicare Other | Admitting: Family Medicine

## 2012-10-13 VITALS — BP 130/80 | HR 84 | Temp 98.0°F | Ht 63.0 in | Wt 142.0 lb

## 2012-10-13 DIAGNOSIS — I10 Essential (primary) hypertension: Secondary | ICD-10-CM

## 2012-10-13 NOTE — Progress Notes (Signed)
  Subjective:    Patient ID: [name], female    DOB: [birth year], 67 y.o.   MRN: [id number]  HPI Followup regarding hypertension and weight loss efforts. Patient has now been on Belviq for several months. Overall, she has lost about 20 pounds She made several positive dietary changes is also exercising regularly. Feels very good overall. Denies any side effects. Has had some slight decreased libido which may be related. Her goal is to get down to around 136 pounds and in July to consider tapering off Belviq.  Past Medical History  Diagnosis Date  . Degenerative disc disease   . Osteoarthritis   . Hypertension   . Osteopenia   . Juvenile rheumatic fever     cardiac work up by dr. Daleen Squibb in 2010--negative--no heart prolems  . Blood transfusion     with hip replacement  . Kidney stones 2004    past hx-past hx --no stones now that pt aware of  . GERD (gastroesophageal reflux disease)     no recent prob and no meds  . Migraine headache     none in years   Past Surgical History  Procedure Laterality Date  . Rt hip arthroplasty total      01/14/2008  . Partial hysterectomy      partial sec to fibroids  . Kidney stones removed      11/12/2002  . Bartholin cyst surgery    . Total hip arthroplasty  05/21/2011    Procedure: TOTAL HIP ARTHROPLASTY ANTERIOR APPROACH;  Surgeon: Shelda Pal, MD;  Location: [facility];  Service: Orthopedics;  Laterality: Left;    reports that she has never smoked. She has never used smokeless tobacco. She reports that  drinks alcohol. She reports that she does not use illicit drugs. family history includes Aneurysm in her other and COPD in her other. Allergies  Allergen Reactions  . Sulfonamide Derivatives Other (See Comments)    Childhood reaction      Review of Systems  Constitutional: Negative for fatigue.  Eyes: Negative for visual disturbance.  Respiratory: Negative for cough, chest tightness, shortness of breath and wheezing.   Cardiovascular:  Negative for chest pain, palpitations and leg swelling.  Neurological: Negative for dizziness, seizures, syncope, weakness, light-headedness and headaches.       Objective:   Physical Exam  Constitutional: She appears well-developed and well-nourished.  Neck: Neck supple. No thyromegaly present.  Cardiovascular: Normal rate and regular rhythm.   Pulmonary/Chest: Effort normal and breath sounds normal. No respiratory distress. She has no wheezes. She has no rales.  Musculoskeletal: She exhibits no edema.          Assessment & Plan:  Hypertension. Stable. Continue Micardis HCTZ. History of elevated BMI. Excellent job with weight loss. Reassess 6 months and if at goal at that point we'll discuss tapering off Belviq.

## 2012-10-21 ENCOUNTER — Encounter: Payer: Self-pay | Admitting: Family Medicine

## 2013-02-02 ENCOUNTER — Other Ambulatory Visit: Payer: Self-pay | Admitting: Family Medicine

## 2013-02-02 NOTE — Telephone Encounter (Signed)
Last visit 10/13/12 Last refill 10/07/12 #180 0 refill

## 2013-02-02 NOTE — Telephone Encounter (Signed)
Refill for 3 months. 

## 2013-04-27 ENCOUNTER — Ambulatory Visit: Payer: Medicare Other | Admitting: Family Medicine

## 2013-08-17 ENCOUNTER — Ambulatory Visit (INDEPENDENT_AMBULATORY_CARE_PROVIDER_SITE_OTHER): Payer: Medicare Other | Admitting: Family Medicine

## 2013-08-17 ENCOUNTER — Telehealth: Payer: Self-pay | Admitting: Family Medicine

## 2013-08-17 ENCOUNTER — Encounter: Payer: Self-pay | Admitting: Family Medicine

## 2013-08-17 VITALS — BP 130/78 | HR 80 | Wt 150.0 lb

## 2013-08-17 DIAGNOSIS — I10 Essential (primary) hypertension: Secondary | ICD-10-CM

## 2013-08-17 DIAGNOSIS — Z2911 Encounter for prophylactic immunotherapy for respiratory syncytial virus (RSV): Secondary | ICD-10-CM

## 2013-08-17 DIAGNOSIS — Z23 Encounter for immunization: Secondary | ICD-10-CM

## 2013-08-17 DIAGNOSIS — M1990 Unspecified osteoarthritis, unspecified site: Secondary | ICD-10-CM

## 2013-08-17 MED ORDER — LORCASERIN HCL 10 MG PO TABS: 1.0000 | ORAL_TABLET | Freq: Two times a day (BID) | ORAL | Status: AC

## 2013-08-17 NOTE — Progress Notes (Signed)
Pre visit review using our clinic review tool, if applicable. No additional management support is needed unless otherwise documented below in the visit note. 

## 2013-08-17 NOTE — Telephone Encounter (Signed)
Relevant patient education mailed to patient.  

## 2013-08-17 NOTE — Progress Notes (Signed)
   Subjective:    Patient ID: [name], female    DOB: [birth year], 68 y.o.   MRN: [id number]  HPI Patient seen for medical followup. She has history of hypertension and has done an excellent job over the past year with weight control and exercise. She took herself off of blood pressure medication several months ago and blood pressures have been stable. No headaches. No dizziness. No recent chest pains.  She has history of being overweight with comorbidities of GERD and osteoarthritis and hypertension. She has taken Belviq 10 mg twice daily and took herself off about a month ago. She has had some mild weight gain since then and requests going back on this. She is tolerating with no side effects.  Never had pneumonia vaccine and requesting today. She has never smoked. No chronic lung disease. Question of rheumatic heart disease but no significant murmur  Intermittent pains involving DIP joints of hands.  Hx of known osteoarthritis.  Pain is mild to moderate.  Past Medical History  Diagnosis Date  . Degenerative disc disease   . Osteoarthritis   . Hypertension   . Osteopenia   . Juvenile rheumatic fever     cardiac work up by dr. Daleen SquibbWall in 2010--negative--no heart prolems  . Blood transfusion     with hip replacement  . Kidney stones 2004    past hx-past hx --no stones now that pt aware of  . GERD (gastroesophageal reflux disease)     no recent prob and no meds  . Migraine headache     none in years   Past Surgical History  Procedure Laterality Date  . Rt hip arthroplasty total      01/14/2008  . Partial hysterectomy      partial sec to fibroids  . Kidney stones removed      11/12/2002  . Bartholin cyst surgery    . Total hip arthroplasty  05/21/2011    Procedure: TOTAL HIP ARTHROPLASTY ANTERIOR APPROACH;  Surgeon: Shelda PalMatthew D Olin, MD;  Location: [facility];  Service: Orthopedics;  Laterality: Left;    reports that she has never smoked. She has never used smokeless tobacco. She reports  that she drinks alcohol. She reports that she does not use illicit drugs. family history includes Aneurysm in her other; COPD in her other. Allergies  Allergen Reactions  . Sulfonamide Derivatives Other (See Comments)    Childhood reaction       Review of Systems  Constitutional: Negative for fatigue.  Eyes: Negative for visual disturbance.  Respiratory: Negative for cough, chest tightness, shortness of breath and wheezing.   Cardiovascular: Negative for chest pain, palpitations and leg swelling.  Endocrine: Negative for polydipsia and polyuria.  Neurological: Negative for dizziness, seizures, syncope, weakness, light-headedness and headaches.       Objective:   Physical Exam  Constitutional: She appears well-developed and well-nourished. No distress.  Cardiovascular: Normal rate and regular rhythm.  Exam reveals no gallop.   No murmur heard. Pulmonary/Chest: Effort normal and breath sounds normal. No respiratory distress. She has no wheezes. She has no rales.  Musculoskeletal: She exhibits no edema.          Assessment & Plan:  #1 history of hypertension. Currently stable and at goal off medication. Continue to observe #2 history of being overweight with comorbidities as above. Patient requesting refills of Belviq. She's tolerated well in the past without side effects. #3 health maintenance. Prevnar 13 given

## 2013-10-21 ENCOUNTER — Encounter: Payer: Self-pay | Admitting: Family Medicine
